# Patient Record
Sex: Female | Born: 1955 | Race: White | Hispanic: No | Marital: Married | Smoking: Never smoker
Health system: Southern US, Community
[De-identification: ages and names within clinical notes are randomized; demographics above are authoritative.]

## PROBLEM LIST (undated history)

## (undated) DIAGNOSIS — M419 Scoliosis, unspecified: Secondary | ICD-10-CM

## (undated) DIAGNOSIS — S62122A Displaced fracture of lunate [semilunar], left wrist, initial encounter for closed fracture: Secondary | ICD-10-CM

## (undated) HISTORY — PX: OTHER SURGICAL HISTORY: SHX169

## (undated) HISTORY — PX: BREAST CYST ASPIRATION: SHX578

## (undated) HISTORY — DX: Scoliosis, unspecified: M41.9

## (undated) HISTORY — DX: Displaced fracture of lunate (semilunar), left wrist, initial encounter for closed fracture: S62.122A

---

## 2006-12-03 ENCOUNTER — Ambulatory Visit: Payer: Self-pay | Admitting: Family Medicine

## 2006-12-03 ENCOUNTER — Encounter (INDEPENDENT_AMBULATORY_CARE_PROVIDER_SITE_OTHER): Payer: Self-pay | Admitting: Internal Medicine

## 2006-12-03 DIAGNOSIS — J069 Acute upper respiratory infection, unspecified: Secondary | ICD-10-CM | POA: Insufficient documentation

## 2006-12-03 DIAGNOSIS — N926 Irregular menstruation, unspecified: Secondary | ICD-10-CM | POA: Insufficient documentation

## 2006-12-03 DIAGNOSIS — M62838 Other muscle spasm: Secondary | ICD-10-CM

## 2006-12-03 DIAGNOSIS — G43909 Migraine, unspecified, not intractable, without status migrainosus: Secondary | ICD-10-CM

## 2006-12-05 ENCOUNTER — Encounter (INDEPENDENT_AMBULATORY_CARE_PROVIDER_SITE_OTHER): Payer: Self-pay | Admitting: Internal Medicine

## 2006-12-11 ENCOUNTER — Telehealth (INDEPENDENT_AMBULATORY_CARE_PROVIDER_SITE_OTHER): Payer: Self-pay | Admitting: Internal Medicine

## 2006-12-18 ENCOUNTER — Telehealth (INDEPENDENT_AMBULATORY_CARE_PROVIDER_SITE_OTHER): Payer: Self-pay | Admitting: Internal Medicine

## 2006-12-27 ENCOUNTER — Ambulatory Visit: Payer: Self-pay | Admitting: Family Medicine

## 2006-12-27 DIAGNOSIS — M4120 Other idiopathic scoliosis, site unspecified: Secondary | ICD-10-CM | POA: Insufficient documentation

## 2006-12-30 ENCOUNTER — Encounter (INDEPENDENT_AMBULATORY_CARE_PROVIDER_SITE_OTHER): Payer: Self-pay | Admitting: Internal Medicine

## 2007-01-09 HISTORY — PX: OTHER SURGICAL HISTORY: SHX169

## 2007-01-17 ENCOUNTER — Encounter (INDEPENDENT_AMBULATORY_CARE_PROVIDER_SITE_OTHER): Payer: Self-pay | Admitting: Internal Medicine

## 2007-01-22 ENCOUNTER — Encounter (INDEPENDENT_AMBULATORY_CARE_PROVIDER_SITE_OTHER): Payer: Self-pay | Admitting: Internal Medicine

## 2007-01-23 ENCOUNTER — Telehealth (INDEPENDENT_AMBULATORY_CARE_PROVIDER_SITE_OTHER): Payer: Self-pay | Admitting: Internal Medicine

## 2007-01-23 DIAGNOSIS — N6009 Solitary cyst of unspecified breast: Secondary | ICD-10-CM

## 2007-02-07 ENCOUNTER — Encounter (INDEPENDENT_AMBULATORY_CARE_PROVIDER_SITE_OTHER): Payer: Self-pay | Admitting: Internal Medicine

## 2007-04-29 ENCOUNTER — Encounter (INDEPENDENT_AMBULATORY_CARE_PROVIDER_SITE_OTHER): Payer: Self-pay | Admitting: Internal Medicine

## 2007-05-26 ENCOUNTER — Encounter (INDEPENDENT_AMBULATORY_CARE_PROVIDER_SITE_OTHER): Payer: Self-pay | Admitting: Internal Medicine

## 2007-06-06 ENCOUNTER — Encounter (INDEPENDENT_AMBULATORY_CARE_PROVIDER_SITE_OTHER): Payer: Self-pay | Admitting: Internal Medicine

## 2007-09-23 ENCOUNTER — Encounter (INDEPENDENT_AMBULATORY_CARE_PROVIDER_SITE_OTHER): Payer: Self-pay | Admitting: Internal Medicine

## 2008-02-23 ENCOUNTER — Encounter (INDEPENDENT_AMBULATORY_CARE_PROVIDER_SITE_OTHER): Payer: Self-pay | Admitting: Internal Medicine

## 2009-09-02 ENCOUNTER — Telehealth: Payer: Self-pay | Admitting: Family Medicine

## 2009-09-16 ENCOUNTER — Encounter: Payer: Self-pay | Admitting: Family Medicine

## 2009-09-17 LAB — CONVERTED CEMR LAB: HDL: 82 mg/dL

## 2009-09-19 ENCOUNTER — Encounter: Payer: Self-pay | Admitting: Family Medicine

## 2009-10-04 ENCOUNTER — Ambulatory Visit: Payer: Self-pay | Admitting: Family Medicine

## 2009-10-06 ENCOUNTER — Telehealth: Payer: Self-pay | Admitting: Family Medicine

## 2009-10-07 ENCOUNTER — Encounter: Payer: Self-pay | Admitting: Family Medicine

## 2009-10-21 ENCOUNTER — Encounter: Payer: Self-pay | Admitting: Family Medicine

## 2009-10-31 ENCOUNTER — Telehealth: Payer: Self-pay | Admitting: Family Medicine

## 2009-11-03 ENCOUNTER — Telehealth: Payer: Self-pay | Admitting: Family Medicine

## 2009-11-24 ENCOUNTER — Ambulatory Visit: Payer: Self-pay | Admitting: Podiatry

## 2010-02-07 NOTE — Progress Notes (Signed)
Summary: needs order for labwork  Phone Note Call from Patient Call back at Work Phone [phone]   Caller: Patient Summary of Call: Pt has appt for physical on 9/26 and she is asking for an order for labwork prior.  She works at [employer].  Please call when ready. Initial call taken by: Lowella Petties CMA,  September 02, 2009 4:35 PM  Follow-up for Phone Call        On my desk. Ruthe Mannan MD  September 05, 2009 7:52 AM  Order mailed to pt. Follow-up by: Lowella Petties CMA,  September 06, 2009 8:03 AM

## 2010-02-07 NOTE — Letter (Signed)
Summary: Generic Letter  [place] [facility]  [address] [place], [place] [address]   Phone: [phone]  Fax: [phone]    09/19/2009  Tamara Kane [address] [place], [place]  [address]  Dear [name],     We have received your lab results and they are within normal limits for your risk factors.  Dr. Dayton Martes says that she will discuss in more detail at your appointment.          Sincerely,      Ruthe Mannan, MD

## 2010-02-07 NOTE — Letter (Signed)
Summary: Results Follow up Letter  [place] [facility]  [address] [place], [place] [address]   Phone: [phone]  Fax: [phone]    10/07/2009 MRN: [id number]  BRUCE MAYERS [address] [place], [place]  [address]  Dear Ms. SAMARA,  The following are the results of your recent test(s):  Test         Result    Pap Smear:        Normal __X___  Not Normal _____ Comments: ______________________________________________________ Cholesterol: LDL(Bad cholesterol):         Your goal is less than:         HDL (Good cholesterol):       Your goal is more than: Comments:  ______________________________________________________ Mammogram:        Normal _____  Not Normal _____ Comments:  ___________________________________________________________________ Hemoccult:        Normal _____  Not normal _______ Comments:    _____________________________________________________________________ Other Tests:    We routinely do not discuss normal results over the telephone.  If you desire a copy of the results, or you have any questions about this information we can discuss them at your next office visit.   Sincerely,      Linde Gillis, CMA (AAMA)for Dr. Ruthe Mannan

## 2010-02-07 NOTE — Progress Notes (Signed)
Summary: topiramate  Phone Note Refill Request Message from:  Fax from Pharmacy on October 31, 2009 3:20 PM  Refills Requested: Medication #1:  TOPAMAX 25 MG TABS 1 tab by mouth qhs   Last Refilled: 10/05/2009 [facility] [phone]  Initial call taken by: Melody Comas,  October 31, 2009 3:21 PM    Prescriptions: TOPAMAX 25 MG TABS (TOPIRAMATE) 1 tab by mouth qhs  #30 x 0   Entered and Authorized by:   Ruthe Mannan MD   Signed by:   Ruthe Mannan MD on 10/31/2009   Method used:   [facility]        [facility] [facility]retail)       [address]       [place], [place]  [address]       Ph: [phone] or [phone]       Fax: [phone]   RxID:   [id number]

## 2010-02-07 NOTE — Progress Notes (Signed)
Summary: not feeling any better  Phone Note Call from Patient Call back at Home Phone [phone]   Caller: Patient Call For: Ruthe Mannan MD Summary of Call: Patient was seen on tusday and was told to call if not feeling any better. She says that she is no better and her cough has gotten worse. She says that her daughter is getting married this weekend and she can't be feeling like this at the wedding. She is asking if she can have something called in [facility].  Initial call taken by: Melody Comas,  October 06, 2009 9:12 AM  Follow-up for Phone Call        [facility] called. in. Ruthe Mannan MD  October 06, 2009 9:25 AM  Rx called to pharmacy, patient notified via telephone.  Follow-up by: Linde Gillis CMA Duncan Dull),  October 06, 2009 9:57 AM    New/Updated Medications: AZITHROMYCIN 250 MG  TABS (AZITHROMYCIN) 2 by  mouth today and then 1 daily for 4 days Prescriptions: AZITHROMYCIN 250 MG  TABS (AZITHROMYCIN) 2 by  mouth today and then 1 daily for 4 days  #6 x 0   Entered and Authorized by:   Ruthe Mannan MD   Signed by:   Ruthe Mannan MD on 10/06/2009   Method used:   [facility]        [facility] (retail)       [address]       [place], [place]  [address]       Ph: [phone] or [phone]       Fax: [phone]   RxID:   [id number]

## 2010-02-07 NOTE — Progress Notes (Signed)
Summary: topamax   Phone Note Refill Request Message from:  Fax from Pharmacy on November 03, 2009 3:52 PM  Refills Requested: Medication #1:  TOPAMAX 25 MG TABS 1 tab by mouth qhs [phone]  Initial call taken by: Melody Comas,  November 03, 2009 4:12 PM    Prescriptions: TOPAMAX 25 MG TABS (TOPIRAMATE) 1 tab by mouth qhs  #90 x 3   Entered and Authorized by:   Ruthe Mannan MD   Signed by:   Ruthe Mannan MD on 11/04/2009   Method used:   Faxed to ...       [phone] (retail)       [address] [address]       [place], [place]  [id number]       Ph: [phone]       Fax: [phone]   RxID:   [id number]

## 2010-02-07 NOTE — Assessment & Plan Note (Signed)
Summary: CPX/BILLIE'S PT/CLE  R/S FROM 10/03/09   Vital Signs:  Patient profile:   55 year old female Height:      63.5 inches Weight:      146 pounds BMI:     25.55 Temp:     98.2 degrees F oral Pulse rate:   84 / minute Pulse rhythm:   regular BP sitting:   130 / 80  (right arm) Cuff size:   regular  Vitals Entered By: Linde Gillis CMA Duncan Dull) (October 04, 2009 8:56 AM) CC: complete physical with pap   History of Present Illness: 55 yo here for CPX.  Migraines- have been getting progressively worse.  Starting menopause and has been having as many as 3 migraines per month.  Associated with nausea, vomiting, and photophobia.  Recenlty has had to miss work for her migraines.  Has never been on migraine prophylaxis.  Immitrex has helped alleviate migraine headaches in past.  No aura or associated focal neurological defecits.  URI- woke up with sinus drainage this morning.  Daughter is getting married this weekend so she hopes that she is not getting sick.  Ears popping.  No fevers, chills, cough, wheezing or SOB.   Well woman- G1P1.  No h/o abnormal pap smears.  Lipid panel this month- TG 45, HDL 82, LDL 110.  Current Medications (verified): 1)  Topamax 25 Mg Tabs (Topiramate) .Marland Kitchen.. 1 Tab By Mouth Qhs 2)  Imitrex 100 Mg Tabs (Sumatriptan Succinate) .Marland Kitchen.. 1 By Mouth At The Onset of Migraine. May Repeat Dose in One Hour If Headache Not Resolved  Allergies (verified): No Known Drug Allergies  Past History:  Past Surgical History: Last updated: 05/26/2007 fx of humerus 1998--rolat blading, fell fx L wrist 6/07  slipped around pool bone fusion  for scioliosis at age 89--from pelvis deviated septum repair --1993 laproscopy for infertility issues--22 yrs ago L breast mass aspiration at 1 o'clock position--02/07/07--Byrnett, repeat aspiration 5/09  Family History: Last updated: 12/12/06 Father: died at 69--in his sleep, no heart issues Mother: 79--arthritis,  Siblings: 2  br--L&W                1 sis--migraines   DM-PGM MI-PGM CVA- 0 Prostate Cancer-0 Breast Cancer-0 Ovarian Cancer-0 Uterine Cancer-0 Colon Cancer-0 Drug/ ETOH Abuse-0 Depression- 0  Social History: Last updated: 12-Dec-2006 Marital Status: Married Children: 1 daughter Occupation: Psychologist, 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  Risk Factors: Caffeine Use: 0 (12-12-06) Exercise: yes (12/12/2006)  Risk Factors: Smoking Status: never (12-12-06) Passive Smoke Exposure: no (12-12-2006)  Review of Systems      See HPI General:  Denies fever and malaise. Eyes:  Denies blurring. ENT:  Denies difficulty swallowing. CV:  Denies chest pain or discomfort. Resp:  Denies cough, shortness of breath, sputum productive, and wheezing. GI:  Complains of nausea and vomiting; denies abdominal pain. GU:  Denies discharge and dysuria. MS:  Denies joint pain, joint redness, and joint swelling. Derm:  Denies rash. Neuro:  Complains of headaches; denies seizures, sensation of room spinning, tingling, tremors, visual disturbances, and weakness. Psych:  Denies anxiety and depression. Endo:  Denies cold intolerance and heat intolerance. Heme:  Denies abnormal bruising and bleeding.  Physical Exam  General:  alert, well-developed, and well-nourished.   Head:  normocephalic, atraumatic, and no abnormalities observed.   Eyes:  pupils equal, pupils round, and no injection.   Ears:  TMs retracted withfluid Nose:  boggy with no  edema Mouth:  injected with no exudate Neck:  no thyromegaly and  no carotid bruits.   Breasts:  No mass, nodules, thickening, tenderness, bulging, retraction, inflamation, nipple discharge or skin changes noted.   Lungs:  normal respiratory effort, no accessory muscle use, and normal breath sounds.   Heart:  normal rate, regular rhythm, and no murmur.   Abdomen:  soft, non-tender, normal bowel sounds, no distention, no masses, no guarding, no rigidity, no abdominal hernia, no  inguinal hernia, no hepatomegaly, and no splenomegaly.   Rectal:  no external abnormalities and no hemorrhoids.   Genitalia:  Pelvic Exam:        External: normal female genitalia without lesions or masses        Vagina: normal without lesions or masses        Cervix: normal without lesions or masses        Adnexa: normal bimanual exam without masses or fullness        Uterus: normal by palpation        Pap smear: performed Msk:  no joint swelling, no joint warmth, and no redness over joints.   has scioliosis with well healed surg scar in thoracic area at vetebrae Extremities:  no edema, dilated capillaries, no varicosities seen Neurologic:  alert & oriented X3, sensation intact to light touch, and gait normal.   Skin:  turgor normal, color normal, and no rashes.   Psych:  normally interactive.     Impression & Recommendations:  Problem # 1:  WELL ADULT EXAM (ICD-V70.0) Reviewed preventive care protocols, scheduled due services, and updated immunizations Discussed nutrition, exercise, diet, and healthy lifestyle.  Schedule colonscopy and mammogram today.  Problem # 2:  URI (ICD-465.9) Assessment: New Likely viral.  Continue supportive care as per pt instructions.  Problem # 3:  MIGRAINE, COMMON (ICD-346.10) Assessment: Deteriorated Will start Topamax for prophylaxis, keep headache journal. Imitrex as needed headache.   Her updated medication list for this problem includes:    Imitrex 100 Mg Tabs (Sumatriptan succinate) .Marland Kitchen... 1 by mouth at the onset of migraine. may repeat dose in one hour if headache not resolved  Complete Medication List: 1)  Topamax 25 Mg Tabs (Topiramate) .Marland Kitchen.. 1 tab by mouth qhs 2)  Imitrex 100 Mg Tabs (Sumatriptan succinate) .Marland Kitchen.. 1 by mouth at the onset of migraine. may repeat dose in one hour if headache not resolved  Other Orders: Gastroenterology Referral (GI) Radiology Referral (Radiology)  Patient Instructions: 1)  Great to meet you. 2)  Please  stop by to see Shirlee Limerick on your way out. 3)  Congratulations on the wedding!!  I hope you have a great weekend. 4)  Get plenty of rest, drink lots of clear liquids, and use Tylenol or Ibuprofen for fever and comfort. Return in 7-10 days if you're not better: sooner if you'er feeling worse.  Prescriptions: IMITREX 100 MG TABS (SUMATRIPTAN SUCCINATE) 1 by mouth at the onset of migraine. May repeat dose in one hour if headache not resolved  #10 x 3   Entered and Authorized by:   Ruthe Mannan MD   Signed by:   Ruthe Mannan MD on 10/04/2009   Method used:   Print then Give to Patient   RxID:   [id number] TOPAMAX 25 MG TABS (TOPIRAMATE) 1 tab by mouth qhs  #30 x 0   Entered and Authorized by:   Ruthe Mannan MD   Signed by:   Ruthe Mannan MD on 10/04/2009   Method used:   Print then Give to Patient   RxID:   [id number]  Prior Medications (reviewed today): None Current Allergies (reviewed today): No known allergies   Flu Vaccine Next Due:  Not Indicated TD Result Date:  10/01/2006 TD Result:  historical HDL Result Date:  09/17/2009 HDL Result:  82 LDL Result Date:  09/17/2009 LDL Result:  110   Prevention & Chronic Care Immunizations   Influenza vaccine: Not documented   Influenza vaccine due: Not Indicated    Tetanus booster: 10/01/2006: historical   Tetanus booster due: 09/30/2016    Pneumococcal vaccine: Not documented  Colorectal Screening   Hemoccult: Not documented    Colonoscopy: Not documented   Colonoscopy action/deferral: GI referral  (10/04/2009)  Other Screening   Pap smear: normal  (12/27/2006)   Pap smear action/deferral: Ordered  (10/04/2009)   Pap smear due: 12/27/2007    Mammogram: Not documented   Mammogram action/deferral: Ordered  (10/04/2009)   Smoking status: never  (12/03/2006)  Lipids   Total Cholesterol: Not documented   LDL: 110  (09/17/2009)   LDL Direct: Not documented   HDL: 82  (09/17/2009)   Triglycerides: Not  documented   Nursing Instructions: GI referral for screening colonoscopy (see order) Pap smear today Schedule screening mammogram (see order)

## 2010-02-23 ENCOUNTER — Encounter: Payer: Self-pay | Admitting: Internal Medicine

## 2010-03-01 NOTE — Letter (Signed)
Summary: Pre Visit Letter Revised  [facility]  [address] [place], [place] [address]   Phone: [phone]  Fax: [phone]        02/23/2010 MRN: [id number]  Tamara Kane [address] [place], [place]  [address]             Procedure Date:  03-09-10 9:30am            Direct Colon Dr Leone Payor  Welcome to the Gastroenterology Division at [facility].    You are scheduled to see a nurse for your pre-procedure visit on 02-28-10 at 10:30am on the 3rd floor at [facility], [address]  We ask that you try to arrive at our office 15 minutes prior to your appointment time to allow for check-in.  Please take a minute to review the attached form.  If you answer "Yes" to one or more of the questions on the first page, we ask that you call the person listed at your earliest opportunity.  If you answer "No" to all of the questions, please complete the rest of the form and bring it to your appointment.    Your nurse visit will consist of discussing your medical and surgical history, your immediate family medical history, and your medications.   If you are unable to list all of your medications on the form, please bring the medication bottles to your appointment and we will list them.  We will need to be aware of both prescribed and over the counter drugs.  We will need to know exact dosage information as well.    Please be prepared to read and sign documents such as consent forms, a financial agreement, and acknowledgement forms.  If necessary, and with your consent, a friend or relative is welcome to sit-in on the nurse visit with you.  Please bring your insurance card so that we may make a copy of it.  If your insurance requires a referral to see a specialist, please bring your referral form from your primary care physician.  No co-pay is required for this nurse visit.     If you cannot keep your appointment, please call [phone] to cancel or reschedule  prior to your appointment date.  This allows [place] the opportunity to schedule an appointment for another patient in need of care.    Thank you for choosing [facility] for your medical needs.  We appreciate the opportunity to care for you.  Please visit [place] at our website  to learn more about our practice.  Sincerely, The Gastroenterology Division

## 2010-03-09 ENCOUNTER — Other Ambulatory Visit: Payer: Self-pay | Admitting: Internal Medicine

## 2010-09-18 ENCOUNTER — Telehealth: Payer: Self-pay | Admitting: *Deleted

## 2010-09-18 NOTE — Telephone Encounter (Signed)
On my desk

## 2010-09-18 NOTE — Telephone Encounter (Signed)
Patient request written order to be mailed to her home address to have labs done at [facility].  She has a physical scheduled for 10/2010 and would like labs prior.  Please advise.

## 2010-09-18 NOTE — Telephone Encounter (Signed)
Left message on machine at home that lab orders were put in the mail today.

## 2010-10-02 ENCOUNTER — Encounter: Payer: Self-pay | Admitting: Family Medicine

## 2010-10-09 ENCOUNTER — Encounter: Payer: Self-pay | Admitting: Family Medicine

## 2010-10-16 ENCOUNTER — Ambulatory Visit (INDEPENDENT_AMBULATORY_CARE_PROVIDER_SITE_OTHER): Payer: 59 | Admitting: Family Medicine

## 2010-10-16 ENCOUNTER — Encounter: Payer: Self-pay | Admitting: Family Medicine

## 2010-10-16 VITALS — BP 110/80 | HR 80 | Temp 98.5°F | Ht 64.5 in | Wt 151.2 lb

## 2010-10-16 DIAGNOSIS — Z0000 Encounter for general adult medical examination without abnormal findings: Secondary | ICD-10-CM | POA: Insufficient documentation

## 2010-10-16 DIAGNOSIS — G43009 Migraine without aura, not intractable, without status migrainosus: Secondary | ICD-10-CM

## 2010-10-16 MED ORDER — SUMATRIPTAN SUCCINATE 100 MG PO TABS: ORAL_TABLET | ORAL | Status: DC

## 2010-10-16 MED ORDER — TOPIRAMATE 25 MG PO TABS: 25.0000 mg | ORAL_TABLET | Freq: Every day | ORAL | Status: DC

## 2010-10-16 NOTE — Progress Notes (Signed)
  Subjective:    Patient ID: Tamara Kane, female    DOB: [birth year], 55 y.o.   MRN: [id number]  HPI  55yo here for CPX.  Migraines- Associated with nausea, vomiting, and photophobia. Topamax 25 mg qhs is helping with prevention. Had 3 migraines in past 3 months but very sensitive to changes in weather. Previously, was only have one every few months since starting Topamax.   Imitrex helping to alleviate migraines quickly.   No aura or associated focal neurological defecits.   Well woman- G1P1.  No h/o abnormal pap smears.   Still has not scheduled her colonoscopy but she will later this winter.  BMET and lipids within normal limits.  Review of Systems See HPI Patient reports no  vision/ hearing changes,anorexia, weight change, fever ,adenopathy, persistant / recurrent hoarseness, swallowing issues, chest pain, edema,persistant / recurrent cough, hemoptysis, dyspnea(rest, exertional, paroxysmal nocturnal), gastrointestinal  bleeding (melena, rectal bleeding), abdominal pain, excessive heart burn, GU symptoms(dysuria, hematuria, pyuria, voiding/incontinence  Issues) syncope, focal weakness, severe memory loss, concerning skin lesions, depression, anxiety, abnormal bruising/bleeding, major joint swelling, breast masses or abnormal vaginal bleeding.       Objective:   Physical Exam BP 110/80  Pulse 80  Temp(Src) 98.5 F (36.9 C) (Oral)  Ht 5' 4.5" (1.638 m)  Wt 151 lb 4 oz (68.607 kg)  BMI 25.56 kg/m2  General:  Well-developed,well-nourished,in no acute distress; alert,appropriate and cooperative throughout examination Head:  normocephalic and atraumatic.   Eyes:  vision grossly intact, pupils equal, pupils round, and pupils reactive to light.   Ears:  R ear normal and L ear normal.   Nose:  no external deformity.   Mouth:  good dentition.   Neck:  No deformities, masses, or tenderness noted. Lungs:  Normal respiratory effort, chest expands symmetrically. Lungs are clear to  auscultation, no crackles or wheezes. Heart:  Normal rate and regular rhythm. S1 and S2 normal without gallop, murmur, click, rub or other extra sounds. Abdomen:  Bowel sounds positive,abdomen soft and non-tender without masses, organomegaly or hernias noted. Msk: + scoliosis with old surgical scars Extremities:  No clubbing, cyanosis, edema, or deformity noted with normal full range of motion of all joints.   Neurologic:  alert & oriented X3 and gait normal.   Skin:  Intact without suspicious lesions or rashes Cervical Nodes:  No lymphadenopathy noted Axillary Nodes:  No palpable lymphadenopathy Psych:  Cognition and judgment appear intact. Alert and cooperative with normal attention span and concentration. No apparent delusions, illusions, hallucinations      Assessment & Plan:   1. Routine general medical examination at a health care facility   Reviewed preventive care protocols, scheduled due services, and updated immunizations Discussed nutrition, exercise, diet, and healthy lifestyle.  IFOB.   2. MIGRAINE, COMMON   Improved.  Continue prophylaxis with topamax and abortive therapy with Imitrex.

## 2010-10-16 NOTE — Patient Instructions (Signed)
Good to see you. Please keep [place] posted about your migraines.

## 2010-10-16 NOTE — Progress Notes (Signed)
[facility]

## 2010-10-27 ENCOUNTER — Other Ambulatory Visit: Payer: 59

## 2010-10-27 ENCOUNTER — Other Ambulatory Visit: Payer: Self-pay | Admitting: Family Medicine

## 2010-10-27 DIAGNOSIS — Z1211 Encounter for screening for malignant neoplasm of colon: Secondary | ICD-10-CM

## 2010-10-30 ENCOUNTER — Encounter: Payer: Self-pay | Admitting: *Deleted

## 2011-10-12 ENCOUNTER — Other Ambulatory Visit: Payer: Self-pay

## 2011-10-12 MED ORDER — TOPIRAMATE 25 MG PO TABS: 25.0000 mg | ORAL_TABLET | Freq: Every day | ORAL | Status: DC

## 2011-10-12 MED ORDER — SUMATRIPTAN SUCCINATE 100 MG PO TABS: ORAL_TABLET | ORAL | Status: DC

## 2011-10-12 NOTE — Telephone Encounter (Signed)
Lab order mailed to patient's home address, advised patient.

## 2011-10-12 NOTE — Telephone Encounter (Signed)
Pt request [facility]ill [facility]. Pt scheduled CPX 12/03/11. Pt works for [employer] and request written order mailed to pts verfied home address to get labs prior to CPX.Please advise.

## 2011-10-12 NOTE — Telephone Encounter (Signed)
Order written, on my desk.

## 2011-11-12 ENCOUNTER — Encounter: Payer: Self-pay | Admitting: Family Medicine

## 2011-12-03 ENCOUNTER — Encounter: Payer: Self-pay | Admitting: Family Medicine

## 2011-12-03 ENCOUNTER — Ambulatory Visit (INDEPENDENT_AMBULATORY_CARE_PROVIDER_SITE_OTHER): Payer: 59 | Admitting: Family Medicine

## 2011-12-03 VITALS — BP 120/80 | HR 88 | Temp 98.0°F | Ht 63.25 in | Wt 148.0 lb

## 2011-12-03 DIAGNOSIS — Z0000 Encounter for general adult medical examination without abnormal findings: Secondary | ICD-10-CM

## 2011-12-03 DIAGNOSIS — Z1211 Encounter for screening for malignant neoplasm of colon: Secondary | ICD-10-CM

## 2011-12-03 NOTE — Progress Notes (Signed)
Subjective:    Patient ID: Tamara Kane, female    DOB: 08/05/1955, 56 y.o.   MRN: [id number]  HPI  56 yo here for CPX.  Overall doing very well.  Still takes Zumba classes regularly.  Migraines- Associated with nausea, vomiting, and photophobia. Topamax 25 mg qhs is helping with prevention. Had 2 migraines in past 2 months but very sensitive to changes in weather. Otherwise, getting them very occasionally.   Imitrex helping to alleviate migraines quickly.   No aura or associated focal neurological defecits.  Well woman- G1P1.  No h/o abnormal pap smears.   LMP 2 years ago.  No post menopausal bleeding.  Sexually active with one partner only. Mammogram scheduled for 01/16/2012.  BMET and lipids within normal limits. HDl 73, LDL 122, TG 44.  Patient Active Problem List  Diagnosis  . MIGRAINE, COMMON  . URI  . BREAST CYST  . IRREGULAR MENSTRUAL CYCLE  . MUSCLE SPASM  . SCOLIOSIS  . Routine general medical examination at a health care facility   Past Medical History  Diagnosis Date  . Fracture of humerus   . Fracture of lunate, left wrist, closed   . Scoliosis    Past Surgical History  Procedure Date  . Bone fusion for scoliosis     age 56  . Left breast mass aspiration 2009    Dr. Lemar Livings   History  Substance Use Topics  . Smoking status: Never Smoker   . Smokeless tobacco: Never Used  . Alcohol Use: Not on file   Family History  Problem Relation Age of Onset  . Diabetes Paternal Grandmother   . Heart disease Paternal Grandmother    No Known Allergies Current Outpatient Prescriptions on File Prior to Visit  Medication Sig Dispense Refill  . SUMAtriptan (IMITREX) 100 MG tablet Take one tablet by mouth at onset of migraine, may repeat in one hour if migraine not resolved  10 tablet  0  . SUMAtriptan (IMITREX) 100 MG tablet Take one tablet by mouth at onset of migraine, may repeat in one hour if migraine not resolved  10 tablet  3  . topiramate (TOPAMAX) 25 MG  tablet Take 1 tablet (25 mg total) by mouth at bedtime.  90 tablet  3   The PMH, PSH, Social History, Family History, Medications, and allergies have been reviewed in [facility], and have been updated if relevant.   Review of Systems See HPI Patient reports no  vision/ hearing changes,anorexia, weight change, fever ,adenopathy, persistant / recurrent hoarseness, swallowing issues, chest pain, edema,persistant / recurrent cough, hemoptysis, dyspnea(rest, exertional, paroxysmal nocturnal), gastrointestinal  bleeding (melena, rectal bleeding), abdominal pain, excessive heart burn, GU symptoms(dysuria, hematuria, pyuria, voiding/incontinence  Issues) syncope, focal weakness, severe memory loss, concerning skin lesions, depression, anxiety, abnormal bruising/bleeding, major joint swelling, breast masses or abnormal vaginal bleeding.       Objective:   Physical Exam BP 120/80  Pulse 88  Temp 98 F (36.7 C)  Ht 5' 3.25" (1.607 m)  Wt 148 lb (67.132 kg)  BMI 26.01 kg/m2  General:  Well-developed,well-nourished,in no acute distress; alert,appropriate and cooperative throughout examination Head:  normocephalic and atraumatic.   Eyes:  vision grossly intact, pupils equal, pupils round, and pupils reactive to light.   Ears:  R ear normal and L ear normal.   Nose:  no external deformity.   Mouth:  good dentition.   Neck:  No deformities, masses, or tenderness noted. Lungs:  Normal respiratory effort, chest expands symmetrically. Lungs are  clear to auscultation, no crackles or wheezes. Heart:  Normal rate and regular rhythm. S1 and S2 normal without gallop, murmur, click, rub or other extra sounds. Abdomen:  Bowel sounds positive,abdomen soft and non-tender without masses, organomegaly or hernias noted. Msk: + scoliosis with old surgical scars Extremities:  No clubbing, cyanosis, edema, or deformity noted with normal full range of motion of all joints.   Neurologic:  alert & oriented X3 and gait normal.     Skin:  Intact without suspicious lesions or rashes Cervical Nodes:  No lymphadenopathy noted Axillary Nodes:  No palpable lymphadenopathy Psych:  Cognition and judgment appear intact. Alert and cooperative with normal attention span and concentration. No apparent delusions, illusions, hallucinations      Assessment & Plan:   1. Routine general medical examination at a health care facility   Reviewed preventive care protocols, scheduled due services, and updated immunizations Discussed nutrition, exercise, diet, and healthy lifestyle.  IFOB.   2. MIGRAINE, COMMON   Improved.  Continue prophylaxis with topamax and abortive therapy with Imitrex.

## 2011-12-03 NOTE — Patient Instructions (Addendum)
Good to see you. Have a wonderful Thanksgiving!  We will call you or send a letter with your stool card results.

## 2012-09-09 ENCOUNTER — Telehealth: Payer: Self-pay | Admitting: Family Medicine

## 2012-09-09 NOTE — Telephone Encounter (Signed)
[facility] Triage Call Report Triage Record Num: [id number] Operator: Maryfrances Bunnell Patient Name: Tamara Kane Call Date & Time: 09/06/2012 8:54:49AM Patient Phone: [phone] PCP: Ruthe Mannan Patient Gender: Female PCP Fax : [phone] Patient DOB: 1955-05-15 Practice Name: [facility] Reason for Call: Caller: [name]/Patient; PCP: Ruthe Mannan (Family Practice); CB#: [phone]; Call regarding Out of town, left her Medication: Topiramate (generic for Topamax at home, and she can t stop it suddenly; Out of town, left Topamax 25mg  at home, takes Q hs; Verified per emr; Per standing orders Topamax 25mg  po Q hs #3 called to [facility] [phone] per Dr. Laury Axon MD. Protocol(s) Used: Office Note Recommended Outcome per Protocol: Information Noted and Sent to Office Reason for Outcome: Caller information to office Care Advice: ~ 09/06/2012 9:05:53AM Page 1 of 1 [id number]

## 2012-10-17 ENCOUNTER — Telehealth: Payer: Self-pay | Admitting: Family Medicine

## 2012-10-17 NOTE — Telephone Encounter (Signed)
Pt works for [employer] and has an apptmt scheduled for a CPE on Monday, Dec. 1st.  She would like for her lab script to be sent to [facility] so she can have her labs drawn there. Thank you.

## 2012-10-31 ENCOUNTER — Other Ambulatory Visit: Payer: Self-pay

## 2012-10-31 NOTE — Telephone Encounter (Signed)
Pt left v/m that pt had CPX with Nicki Reaper NP on 12/08/12 and pt said that was cancelled; pt rescheduled with Dr Dayton Martes for CPX on 02/05/12. Pt request refill of imitrex and topamax to [facility]..Please advise. Pt request cb.

## 2012-11-03 MED ORDER — TOPIRAMATE 25 MG PO TABS: 25.0000 mg | ORAL_TABLET | Freq: Every day | ORAL | Status: DC

## 2012-11-03 MED ORDER — SUMATRIPTAN SUCCINATE 100 MG PO TABS: ORAL_TABLET | ORAL | Status: DC

## 2012-11-03 NOTE — Telephone Encounter (Signed)
meds refill sent to [facility] as instructed. Pt notified done.

## 2012-11-03 NOTE — Telephone Encounter (Signed)
Ok to refill 

## 2012-11-05 ENCOUNTER — Other Ambulatory Visit: Payer: Self-pay | Admitting: Internal Medicine

## 2012-11-05 DIAGNOSIS — Z0000 Encounter for general adult medical examination without abnormal findings: Secondary | ICD-10-CM

## 2012-11-05 NOTE — Telephone Encounter (Signed)
Terri, I have ordered these CP X labs, can you print out the rec and send to [facility] for me?

## 2012-11-05 NOTE — Addendum Note (Signed)
Addended by: Alvina Chou on: 11/05/2012 11:20 AM   Modules accepted: Orders

## 2012-12-08 ENCOUNTER — Encounter: Payer: 59 | Admitting: Internal Medicine

## 2013-02-04 ENCOUNTER — Encounter: Payer: 59 | Admitting: Family Medicine

## 2013-02-09 ENCOUNTER — Ambulatory Visit (INDEPENDENT_AMBULATORY_CARE_PROVIDER_SITE_OTHER): Payer: 59 | Admitting: Family Medicine

## 2013-02-09 ENCOUNTER — Other Ambulatory Visit (HOSPITAL_COMMUNITY)
Admission: RE | Admit: 2013-02-09 | Discharge: 2013-02-09 | Disposition: A | Discharge status: Home / Self Care | Payer: 59 | Source: Ambulatory Visit | Attending: Family Medicine | Admitting: Family Medicine

## 2013-02-09 ENCOUNTER — Encounter: Payer: Self-pay | Admitting: Family Medicine

## 2013-02-09 VITALS — BP 118/68 | HR 88 | Temp 97.9°F | Ht 62.5 in | Wt 153.2 lb

## 2013-02-09 DIAGNOSIS — Z01419 Encounter for gynecological examination (general) (routine) without abnormal findings: Secondary | ICD-10-CM | POA: Insufficient documentation

## 2013-02-09 DIAGNOSIS — Z1231 Encounter for screening mammogram for malignant neoplasm of breast: Secondary | ICD-10-CM

## 2013-02-09 DIAGNOSIS — Z0000 Encounter for general adult medical examination without abnormal findings: Secondary | ICD-10-CM

## 2013-02-09 DIAGNOSIS — N390 Urinary tract infection, site not specified: Secondary | ICD-10-CM

## 2013-02-09 DIAGNOSIS — Z1211 Encounter for screening for malignant neoplasm of colon: Secondary | ICD-10-CM

## 2013-02-09 DIAGNOSIS — G43009 Migraine without aura, not intractable, without status migrainosus: Secondary | ICD-10-CM

## 2013-02-09 LAB — POCT URINALYSIS DIPSTICK
BILIRUBIN UA: NEGATIVE
GLUCOSE UA: NEGATIVE
Ketones, UA: NEGATIVE
Nitrite, UA: NEGATIVE
PH UA: 6.5
Spec Grav, UA: 1.015
Urobilinogen, UA: 0.2

## 2013-02-09 MED ORDER — SUMATRIPTAN SUCCINATE 100 MG PO TABS: ORAL_TABLET | ORAL | Status: DC

## 2013-02-09 MED ORDER — TOPIRAMATE 50 MG PO TABS
50.0000 mg | ORAL_TABLET | Freq: Every day | ORAL | Status: DC
Start: 2013-02-09 — End: 2014-03-04

## 2013-02-09 NOTE — Assessment & Plan Note (Signed)
Reviewed preventive care protocols, scheduled due services, and updated immunizations Discussed nutrition, exercise, diet, and healthy lifestyle.  Labs requested from [facility].

## 2013-02-09 NOTE — Assessment & Plan Note (Signed)
Pap smear done today

## 2013-02-09 NOTE — Progress Notes (Signed)
Subjective:    Patient ID: Tamara Kane, female    DOB: 02-09-1955, 58 y.o.   MRN: [id number]  HPI  58 yo pleasant female here for CPX.  Had labs done at [facility]- we have not yet received them.  Treated for UTI with 3 day course of cipro on 1/8 (walk in clinic).  Still having some intermittent dysuria.  No back pain or fevers.  No hematuria.  Never had a colonoscopy but does do routine IFOB.  Denies any changes in her bowel habits or blood in her stool.  Overall doing very well.  Still takes Zumba classes regularly. Wt Readings from Last 3 Encounters:  02/09/13 153 lb 4 oz (69.514 kg)  12/03/11 148 lb (67.132 kg)  10/16/10 151 lb 4 oz (68.607 kg)     Migraines- Associated with nausea, vomiting, and photophobia. Topamax 25 mg qhs is helping with prevention but since June has been having more- at least 1 per month.  Seems to be sensitive to changes in weather and decreased sleep. Otherwise, getting them very occasionally.   Imitrex helping to alleviate migraines quickly.   No aura or associated focal neurological defecits.  Well woman- G1P1.  No h/o abnormal pap smears.   LMP 4 years ago.  No post menopausal bleeding.  Sexually active with one partner only. Last pap smear was in 09/2009.   Patient Active Problem List   Diagnosis Date Noted  . Routine general medical examination at a health care facility 10/16/2010  . SCOLIOSIS 12/27/2006  . MIGRAINE, COMMON 12/03/2006  . IRREGULAR MENSTRUAL CYCLE 12/03/2006  . MUSCLE SPASM 12/03/2006   Past Medical History  Diagnosis Date  . Fracture of humerus   . Fracture of lunate, left wrist, closed   . Scoliosis    Past Surgical History  Procedure Laterality Date  . Bone fusion for scoliosis      age 58  . Left breast mass aspiration  2009    Dr. Lemar Livings   History  Substance Use Topics  . Smoking status: Never Smoker   . Smokeless tobacco: Never Used  . Alcohol Use: Not on file   Family History  Problem Relation Age of  Onset  . Diabetes Paternal Grandmother   . Heart disease Paternal Grandmother    No Known Allergies Current Outpatient Prescriptions on File Prior to Visit  Medication Sig Dispense Refill  . SUMAtriptan (IMITREX) 100 MG tablet Take one tablet by mouth at onset of migraine, may repeat in one hour if migraine not resolved  10 tablet  1  . topiramate (TOPAMAX) 25 MG tablet Take 1 tablet (25 mg total) by mouth at bedtime.  90 tablet  1   No current facility-administered medications on file prior to visit.   The PMH, PSH, Social History, Family History, Medications, and allergies have been reviewed in [facility], and have been updated if relevant.   Review of Systems See HPI Patient reports no  vision/ hearing changes,anorexia, weight change, fever ,adenopathy, persistant / recurrent hoarseness, swallowing issues, chest pain, edema,persistant / recurrent cough, hemoptysis, dyspnea(rest, exertional, paroxysmal nocturnal), gastrointestinal  bleeding (melena, rectal bleeding), abdominal pain, excessive heart burn, GU symptoms(dysuria, hematuria, pyuria, voiding/incontinence  Issues) syncope, focal weakness, severe memory loss, concerning skin lesions, depression, anxiety, abnormal bruising/bleeding, major joint swelling, breast masses or abnormal vaginal bleeding.       Objective:   Physical Exam BP 118/68  Pulse 88  Temp(Src) 97.9 F (36.6 C) (Oral)  Ht 5' 2.5" (1.588 m)  Wt  153 lb 4 oz (69.514 kg)  BMI 27.57 kg/m2  SpO2 95%   General:  Well-developed,well-nourished,in no acute distress; alert,appropriate and cooperative throughout examination Head:  normocephalic and atraumatic.   Eyes:  vision grossly intact, pupils equal, pupils round, and pupils reactive to light.   Ears:  R ear normal and L ear normal.   Nose:  no external deformity.   Mouth:  good dentition.   Neck:  No deformities, masses, or tenderness noted. Breasts:  No mass, nodules, thickening, tenderness, bulging, retraction,  inflamation, nipple discharge or skin changes noted.   Lungs:  Normal respiratory effort, chest expands symmetrically. Lungs are clear to auscultation, no crackles or wheezes. Heart:  Normal rate and regular rhythm. S1 and S2 normal without gallop, murmur, click, rub or other extra sounds. Abdomen:  Bowel sounds positive,abdomen soft and non-tender without masses, organomegaly or hernias noted. Rectal:  no external abnormalities.   Genitalia:  Pelvic Exam:        External: normal female genitalia without lesions or masses        Vagina: normal without lesions or masses        Cervix: normal without lesions or masses        Adnexa: normal bimanual exam without masses or fullness        Uterus: normal by palpation        Pap smear: performed Msk:    + scoliosis with old surgical scars Extremities:  No clubbing, cyanosis, edema, or deformity noted with normal full range of motion of all joints.   Neurologic:  alert & oriented X3 and gait normal.   Skin:  Intact without suspicious lesions or rashes Cervical Nodes:  No lymphadenopathy noted Axillary Nodes:  No palpable lymphadenopathy Psych:  Cognition and judgment appear intact. Alert and cooperative with normal attention span and concentration. No apparent delusions, illusions, hallucinations       Assessment & Plan:

## 2013-02-09 NOTE — Assessment & Plan Note (Signed)
S/p cipro. UA positive for trace LE and trace RBC. Will send for cx. No tx for now.

## 2013-02-09 NOTE — Patient Instructions (Addendum)
Great to see you. [facility] to [facility] your [facility] [phone]).  I will call you once I have reviewed your lab work.  Please pick up your stool cards from the lab.  We are increasing your topamax to 50 mg nightly.  Call me with an update.    We will call you with your urine culture results.

## 2013-02-09 NOTE — Assessment & Plan Note (Signed)
Deteriorated. Increase topamax to 50 mg qhs. Call or return to clinic prn if these symptoms worsen or fail to improve as anticipated. The patient indicates understanding of these issues and agrees with the plan.

## 2013-02-09 NOTE — Progress Notes (Signed)
Pre-visit discussion using our clinic review tool. No additional management support is needed unless otherwise documented below in the visit note.  

## 2013-02-10 LAB — URINE CULTURE
Colony Count: NO GROWTH
Organism ID, Bacteria: NO GROWTH

## 2013-02-16 ENCOUNTER — Encounter: Payer: Self-pay | Admitting: Family Medicine

## 2013-03-04 ENCOUNTER — Other Ambulatory Visit (INDEPENDENT_AMBULATORY_CARE_PROVIDER_SITE_OTHER): Payer: 59

## 2013-03-04 DIAGNOSIS — Z1211 Encounter for screening for malignant neoplasm of colon: Secondary | ICD-10-CM

## 2013-03-04 LAB — FECAL OCCULT BLOOD, IMMUNOCHEMICAL: Fecal Occult Bld: NEGATIVE

## 2013-03-06 ENCOUNTER — Encounter: Payer: Self-pay | Admitting: *Deleted

## 2014-02-17 ENCOUNTER — Telehealth: Payer: Self-pay | Admitting: Family Medicine

## 2014-02-17 NOTE — Telephone Encounter (Signed)
Patient has an appointment for a cpx on 03/04/14.  Patient works for [employer] and needs an order mailed to her to have the lab work done.

## 2014-02-19 ENCOUNTER — Other Ambulatory Visit: Payer: Self-pay | Admitting: Internal Medicine

## 2014-02-19 DIAGNOSIS — Z0000 Encounter for general adult medical examination without abnormal findings: Secondary | ICD-10-CM

## 2014-02-19 NOTE — Addendum Note (Signed)
Addended by: Alvina ChouWALSH, Taliesin Hartlage J on: 02/19/2014 02:19 PM   Modules accepted: Orders

## 2014-02-19 NOTE — Telephone Encounter (Signed)
Future Labs have been entered for [facility]. Terri- can these just be electronically released, if not we need to print recs to fax

## 2014-03-04 ENCOUNTER — Encounter: Payer: Self-pay | Admitting: Family Medicine

## 2014-03-04 ENCOUNTER — Ambulatory Visit (INDEPENDENT_AMBULATORY_CARE_PROVIDER_SITE_OTHER): Payer: 59 | Admitting: Family Medicine

## 2014-03-04 VITALS — BP 124/74 | HR 85 | Temp 97.7°F | Ht 63.0 in | Wt 151.4 lb

## 2014-03-04 DIAGNOSIS — Z1211 Encounter for screening for malignant neoplasm of colon: Secondary | ICD-10-CM

## 2014-03-04 DIAGNOSIS — Z1231 Encounter for screening mammogram for malignant neoplasm of breast: Secondary | ICD-10-CM

## 2014-03-04 DIAGNOSIS — Z0000 Encounter for general adult medical examination without abnormal findings: Secondary | ICD-10-CM

## 2014-03-04 DIAGNOSIS — G43709 Chronic migraine without aura, not intractable, without status migrainosus: Secondary | ICD-10-CM

## 2014-03-04 MED ORDER — ONDANSETRON 4 MG PO TBDP: 4.0000 mg | ORAL_TABLET | Freq: Three times a day (TID) | ORAL | Status: DC | PRN

## 2014-03-04 MED ORDER — TOPIRAMATE 50 MG PO TABS: 50.0000 mg | ORAL_TABLET | Freq: Every day | ORAL | Status: DC

## 2014-03-04 MED ORDER — SUMATRIPTAN SUCCINATE 100 MG PO TABS: ORAL_TABLET | ORAL | Status: DC

## 2014-03-04 NOTE — Assessment & Plan Note (Signed)
Deteriorated this month but last month, did not have any migraines. Will continue current rx and eRx sent for zofran to use prn nausea/vomiting with her migraines. The patient indicates understanding of these issues and agrees with the plan.

## 2014-03-04 NOTE — Progress Notes (Signed)
Pre visit review using our clinic review tool, if applicable. No additional management support is needed unless otherwise documented below in the visit note. 

## 2014-03-04 NOTE — Progress Notes (Signed)
Subjective:    Patient ID: Tamara Kane, female    DOB: 08-24-1955, 59 y.o.   MRN: [id number]  HPI  59 yo pleasant female here for CPX. Did get her flu shot at work. Tdap 2008  Pap smear done by me- 02/09/13.   G1P1.  No h/o abnormal pap smears.   LMP 5 years ago.  No post menopausal bleeding.  Sexually active with one partner only.  Never had a colonoscopy but does do routine IFOB.  Denies any changes in her bowel habits or blood in her stool. No family history of colon cancer.  Overall doing very well- excited for her trip to [place] wit her family this Friday.   Wt Readings from Last 3 Encounters:  03/04/14 151 lb 6.4 oz (68.675 kg)  02/09/13 153 lb 4 oz (69.514 kg)  12/03/11 148 lb (67.132 kg)     Migraines- Associated with nausea, vomiting, and photophobia. Has been worse this month- has already had two migraines associated with vomiting this month.  She thinks these are triggered from sinus headaches and extreme changes in weather.  Topamax 50 mg qhs (increased from 25 mg qhs last year) is helping with prevention but since June has been having more- at least 1 per month.  Imitrex helping to alleviate migraines quickly.   No aura or associated focal neurological defecits.     Patient Active Problem List   Diagnosis Date Noted  . UTI (urinary tract infection) 02/09/2013  . Encounter for routine gynecological examination 02/09/2013  . Routine general medical examination at a health care facility 10/16/2010  . SCOLIOSIS 12/27/2006  . MIGRAINE, COMMON 12/03/2006  . IRREGULAR MENSTRUAL CYCLE 12/03/2006  . MUSCLE SPASM 12/03/2006   Past Medical History  Diagnosis Date  . Fracture of humerus   . Fracture of lunate, left wrist, closed   . Scoliosis    Past Surgical History  Procedure Laterality Date  . Bone fusion for scoliosis      age 59  . Left breast mass aspiration  2009    Dr. Lemar Livings   History  Substance Use Topics  . Smoking status: Never Smoker   .  Smokeless tobacco: Never Used  . Alcohol Use: Not on file   Family History  Problem Relation Age of Onset  . Diabetes Paternal Grandmother   . Heart disease Paternal Grandmother    No Known Allergies No current outpatient prescriptions on file prior to visit.   No current facility-administered medications on file prior to visit.   The PMH, PSH, Social History, Family History, Medications, and allergies have been reviewed in [facility], and have been updated if relevant.   Review of Systems Review of Systems  Constitutional: Negative.   HENT: Positive for sinus pressure.   Eyes: Negative.   Respiratory: Negative.   Cardiovascular: Negative.   Gastrointestinal: Positive for nausea and vomiting.  Genitourinary: Negative.   Musculoskeletal: Negative.   Allergic/Immunologic: Negative.   Neurological: Positive for headaches.  Hematological: Negative.   Psychiatric/Behavioral: Negative.         Objective:   Physical Exam Pulse 85  Temp(Src) 97.7 F (36.5 C) (Oral)  Ht  (1.6 m)  Wt 151 lb 6.4 oz (68.675 kg)  BMI 26.83 kg/m2   General:  Well-developed,well-nourished,in no acute distress; alert,appropriate and cooperative throughout examination Head:  normocephalic and atraumatic.   Eyes:  vision grossly intact, pupils equal, pupils round, and pupils reactive to light.   Ears:  R ear normal and L  ear normal.   Nose:  no external deformity.   Mouth:  good dentition.   Neck:  No deformities, masses, or tenderness noted. Breasts:  No mass, nodules, thickening, tenderness, bulging, retraction, inflamation, nipple discharge or skin changes noted.   Lungs:  Normal respiratory effort, chest expands symmetrically. Lungs are clear to auscultation, no crackles or wheezes. Heart:  Normal rate and regular rhythm. S1 and S2 normal without gallop, murmur, click, rub or other extra sounds. Abdomen:  Bowel sounds positive,abdomen soft and non-tender without masses, organomegaly or hernias  noted. Msk:   + scoliosis with old surgical scars Extremities:  No clubbing, cyanosis, edema, or deformity noted with normal full range of motion of all joints.   Neurologic:  alert & oriented X3 and gait normal.   Skin:  Intact without suspicious lesions or rashes Cervical Nodes:  No lymphadenopathy noted Axillary Nodes:  No palpable lymphadenopathy Psych:  Cognition and judgment appear intact. Alert and cooperative with normal attention span and concentration. No apparent delusions, illusions, hallucinations       Assessment & Plan:

## 2014-03-04 NOTE — Patient Instructions (Addendum)
Great to see you. Please call [facility] to get your mammogram.  Please do pick up your stool cards.

## 2014-03-04 NOTE — Assessment & Plan Note (Signed)
Reviewed preventive care protocols, scheduled due services, and updated immunizations Discussed nutrition, exercise, diet, and healthy lifestyle.  Order given for her to have labs done at [facility].

## 2014-03-05 NOTE — Addendum Note (Signed)
Addended by: Alvina ChouWALSH, TERRI J on: 03/05/2014 11:32 AM   Modules accepted: Orders

## 2014-08-03 ENCOUNTER — Telehealth: Payer: Self-pay

## 2014-08-03 NOTE — Telephone Encounter (Signed)
Left a voicemail for patient in regards to scheduling a Mammogram.  

## 2014-08-04 NOTE — Telephone Encounter (Signed)
Error

## 2015-02-01 ENCOUNTER — Other Ambulatory Visit: Payer: Self-pay | Admitting: Family Medicine

## 2015-02-24 ENCOUNTER — Encounter: Payer: Self-pay | Admitting: Family Medicine

## 2015-02-24 ENCOUNTER — Ambulatory Visit (INDEPENDENT_AMBULATORY_CARE_PROVIDER_SITE_OTHER): Payer: 59 | Admitting: Family Medicine

## 2015-02-24 VITALS — BP 122/72 | HR 67 | Temp 97.6°F | Ht 63.0 in | Wt 145.5 lb

## 2015-02-24 DIAGNOSIS — G43709 Chronic migraine without aura, not intractable, without status migrainosus: Secondary | ICD-10-CM

## 2015-02-24 DIAGNOSIS — Z1159 Encounter for screening for other viral diseases: Secondary | ICD-10-CM | POA: Diagnosis not present

## 2015-02-24 DIAGNOSIS — Z0000 Encounter for general adult medical examination without abnormal findings: Secondary | ICD-10-CM

## 2015-02-24 DIAGNOSIS — Z1211 Encounter for screening for malignant neoplasm of colon: Secondary | ICD-10-CM

## 2015-02-24 DIAGNOSIS — Z01419 Encounter for gynecological examination (general) (routine) without abnormal findings: Secondary | ICD-10-CM | POA: Diagnosis not present

## 2015-02-24 MED ORDER — SUMATRIPTAN SUCCINATE 100 MG PO TABS: ORAL_TABLET | ORAL | Status: DC

## 2015-02-24 MED ORDER — TOPIRAMATE 50 MG PO TABS
50.0000 mg | ORAL_TABLET | Freq: Every day | ORAL | Status: DC
Start: 2015-02-24 — End: 2015-09-27

## 2015-02-24 NOTE — Progress Notes (Signed)
Subjective:    Patient ID: Tamara Kane, female    DOB: [birth year], 60 y.o.   MRN: [id number]  HPI  60 yo pleasant female here for CPX. Did get her flu shot at work. Tdap 2008  ?mammogram  Pap smear done by me- 02/09/13.   G1P1.  No h/o abnormal pap smears.   LMP 6 years ago.  No post menopausal bleeding.  Sexually active with one partner only.  Never had a colonoscopy but did do IFOB.  Did not return them last year.    Denies any changes in her bowel habits or blood in her stool. No family history of colon cancer.  Has lost 10 pounds with weight watchers.  She is very pleased about this. Wt Readings from Last 3 Encounters:  02/24/15 145 lb 8 oz (65.998 kg)  03/04/14 151 lb 6.4 oz (68.675 kg)  02/09/13 153 lb 4 oz (69.514 kg)     Migraines- Associated with nausea, vomiting, and photophobia. Topamax 50 mg qhs is helpful for prophylaxis. Imitrex helping to alleviate migraines quickly.   No aura or associated focal neurological defecits.    Lab Results  Component Value Date   HDL 82 09/17/2009   LDLCALC 110 09/17/2009   No results found for: CREATININE No results found for: NA, K, CL, CO2  Patient Active Problem List   Diagnosis Date Noted  . Encounter for routine gynecological examination 02/09/2013  . Routine general medical examination at a health care facility 10/16/2010  . SCOLIOSIS 12/27/2006  . Migraine 12/03/2006   Past Medical History  Diagnosis Date  . Fracture of humerus   . Fracture of lunate, left wrist, closed   . Scoliosis    Past Surgical History  Procedure Laterality Date  . Bone fusion for scoliosis      age 31  . Left breast mass aspiration  2009    Dr. Lemar Livings   Social History  Substance Use Topics  . Smoking status: Never Smoker   . Smokeless tobacco: Never Used  . Alcohol Use: None   Family History  Problem Relation Age of Onset  . Diabetes Paternal Grandmother   . Heart disease Paternal Grandmother    No Known  Allergies Current Outpatient Prescriptions on File Prior to Visit  Medication Sig Dispense Refill  . ondansetron (ZOFRAN ODT) 4 MG disintegrating tablet Take 1 tablet (4 mg total) by mouth every 8 (eight) hours as needed for nausea or vomiting. 20 tablet 0   No current facility-administered medications on file prior to visit.   The PMH, PSH, Social History, Family History, Medications, and allergies have been reviewed in [facility], and have been updated if relevant.   Review of Systems Review of Systems  Constitutional: Negative.   HENT: Negative for sinus pressure.   Eyes: Negative.   Respiratory: Negative.   Cardiovascular: Negative.   Gastrointestinal: Negative for nausea and vomiting.  Genitourinary: Negative.   Musculoskeletal: Negative.   Allergic/Immunologic: Negative.   Neurological: Negative for headaches.  Hematological: Negative.   Psychiatric/Behavioral: Negative.         Objective:   Physical Exam Ht  (1.6 m)  Wt 145 lb 8 oz (65.998 kg)  BMI 25.78 kg/m2   General:  Well-developed,well-nourished,in no acute distress; alert,appropriate and cooperative throughout examination Head:  normocephalic and atraumatic.   Eyes:  vision grossly intact, pupils equal, pupils round, and pupils reactive to light.   Ears:  R ear normal and L ear normal.   Nose:  no  external deformity.   Mouth:  good dentition.   Neck:  No deformities, masses, or tenderness noted. Breasts:  No mass, nodules, thickening, tenderness, bulging, retraction, inflamation, nipple discharge or skin changes noted.   Lungs:  Normal respiratory effort, chest expands symmetrically. Lungs are clear to auscultation, no crackles or wheezes. Heart:  Normal rate and regular rhythm. S1 and S2 normal without gallop, murmur, click, rub or other extra sounds. Abdomen:  Bowel sounds positive,abdomen soft and non-tender without masses, organomegaly or hernias noted. Msk:   + scoliosis with old surgical  scars Extremities:  No clubbing, cyanosis, edema, or deformity noted with normal full range of motion of all joints.   Neurologic:  alert & oriented X3 and gait normal.   Skin:  Intact without suspicious lesions or rashes Cervical Nodes:  No lymphadenopathy noted Axillary Nodes:  No palpable lymphadenopathy Psych:  Cognition and judgment appear intact. Alert and cooperative with normal attention span and concentration. No apparent delusions, illusions, hallucinations       Assessment & Plan:

## 2015-02-24 NOTE — Patient Instructions (Signed)
Great to see you. Please call to schedule your mammogram.  We will call you with your results from today.

## 2015-02-24 NOTE — Assessment & Plan Note (Signed)
Reviewed preventive care protocols, scheduled due services, and updated immunizations Discussed nutrition, exercise, diet, and healthy lifestyle.  Mammogram ordered- pt to schedule.  Labs today.  Orders Placed This Encounter  Procedures  . Fecal occult blood, imunochemical  . CBC with Differential/Platelet  . Comprehensive metabolic panel  . Lipid panel  . TSH  . Hepatitis C Antibody  . HIV antibody (with reflex)

## 2015-02-24 NOTE — Addendum Note (Signed)
Addended by: Baldomero Lamy on: 02/24/2015 12:46 PM   Modules accepted: Orders, SmartSet

## 2015-02-24 NOTE — Progress Notes (Signed)
Pre visit review using our clinic review tool, if applicable. No additional management support is needed unless otherwise documented below in the visit note. 

## 2015-02-24 NOTE — Assessment & Plan Note (Signed)
Well controlled with current rxs. No changes made today. 

## 2015-02-25 LAB — COMPREHENSIVE METABOLIC PANEL
A/G RATIO: 2 (ref 1.1–2.5)
ALBUMIN: 4.5 g/dL (ref 3.5–5.5)
ALK PHOS: 81 IU/L (ref 39–117)
ALT: 17 IU/L (ref 0–32)
AST: 19 IU/L (ref 0–40)
BILIRUBIN TOTAL: 0.3 mg/dL (ref 0.0–1.2)
BUN / CREAT RATIO: 23 (ref 9–23)
BUN: 15 mg/dL (ref 6–24)
CHLORIDE: 103 mmol/L (ref 96–106)
CO2: 20 mmol/L (ref 18–29)
CREATININE: 0.65 mg/dL (ref 0.57–1.00)
Calcium: 9.5 mg/dL (ref 8.7–10.2)
GFR calc Af Amer: 114 mL/min/{1.73_m2} (ref >59)
GFR calc non Af Amer: 99 mL/min/{1.73_m2} (ref >59)
GLOBULIN, TOTAL: 2.2 g/dL (ref 1.5–4.5)
Glucose: 82 mg/dL (ref 65–99)
POTASSIUM: 4.1 mmol/L (ref 3.5–5.2)
SODIUM: 140 mmol/L (ref 134–144)
Total Protein: 6.7 g/dL (ref 6.0–8.5)

## 2015-02-25 LAB — LIPID PANEL
CHOLESTEROL TOTAL: 210 mg/dL — AB (ref 100–199)
Chol/HDL Ratio: 2.7 ratio units (ref 0.0–4.4)
HDL: 79 mg/dL (ref >39)
LDL Calculated: 122 mg/dL — ABNORMAL HIGH (ref 0–99)
Triglycerides: 43 mg/dL (ref 0–149)
VLDL CHOLESTEROL CAL: 9 mg/dL (ref 5–40)

## 2015-02-25 LAB — CBC WITH DIFFERENTIAL/PLATELET
Basophils Absolute: 0 10*3/uL (ref 0.0–0.2)
Basos: 0 %
EOS (ABSOLUTE): 0.3 10*3/uL (ref 0.0–0.4)
EOS: 5 %
HEMATOCRIT: 39.3 % (ref 34.0–46.6)
HEMOGLOBIN: 13.5 g/dL (ref 11.1–15.9)
Immature Grans (Abs): 0 10*3/uL (ref 0.0–0.1)
Immature Granulocytes: 0 %
LYMPHS ABS: 1.7 10*3/uL (ref 0.7–3.1)
Lymphs: 27 %
MCH: 31 pg (ref 26.6–33.0)
MCHC: 34.4 g/dL (ref 31.5–35.7)
MCV: 90 fL (ref 79–97)
MONOCYTES: 12 %
MONOS ABS: 0.7 10*3/uL (ref 0.1–0.9)
NEUTROS ABS: 3.6 10*3/uL (ref 1.4–7.0)
Neutrophils: 56 %
Platelets: 225 10*3/uL (ref 150–379)
RBC: 4.36 x10E6/uL (ref 3.77–5.28)
RDW: 12.9 % (ref 12.3–15.4)
WBC: 6.4 10*3/uL (ref 3.4–10.8)

## 2015-02-25 LAB — TSH: TSH: 4.81 u[IU]/mL — AB (ref 0.450–4.500)

## 2015-02-25 LAB — HIV ANTIBODY (ROUTINE TESTING W REFLEX): HIV SCREEN 4TH GENERATION: NONREACTIVE

## 2015-02-25 LAB — HEPATITIS C ANTIBODY

## 2015-03-01 ENCOUNTER — Encounter: Payer: Self-pay | Admitting: *Deleted

## 2015-09-27 ENCOUNTER — Other Ambulatory Visit: Payer: Self-pay | Admitting: Family Medicine

## 2015-12-15 ENCOUNTER — Ambulatory Visit (INDEPENDENT_AMBULATORY_CARE_PROVIDER_SITE_OTHER): Payer: 59 | Admitting: Family Medicine

## 2015-12-15 ENCOUNTER — Encounter: Payer: Self-pay | Admitting: Family Medicine

## 2015-12-15 VITALS — BP 122/80 | HR 83 | Temp 97.6°F | Wt 158.5 lb

## 2015-12-15 DIAGNOSIS — Z23 Encounter for immunization: Secondary | ICD-10-CM | POA: Diagnosis not present

## 2015-12-15 DIAGNOSIS — J069 Acute upper respiratory infection, unspecified: Secondary | ICD-10-CM | POA: Diagnosis not present

## 2015-12-15 MED ORDER — HYDROCOD POLST-CPM POLST ER 10-8 MG/5ML PO SUER: 5.0000 mL | Freq: Two times a day (BID) | ORAL | 0 refills | Status: DC | PRN

## 2015-12-15 NOTE — Addendum Note (Signed)
Addended by: Desmond DikeKNIGHT, Pattie Flaharty H on: 12/15/2015 12:37 PM   Modules accepted: Orders

## 2015-12-15 NOTE — Progress Notes (Signed)
SUBJECTIVE:  Tamara Kane is a 60 y.o. female who complains of coryza, congestion, sneezing and productive cough for 7 days. She denies a history of anorexia and chest pain and denies a history of asthma. Patient denies smoke cigarettes.   Current Outpatient Prescriptions on File Prior to Visit  Medication Sig Dispense Refill  . SUMAtriptan (IMITREX) 100 MG tablet Take one tablet by mouth at onset of migraine, may repeat in one hour if migraine not resolved 10 tablet 5  . topiramate (TOPAMAX) 50 MG tablet Take 1 tablet by mouth at  bedtime 90 tablet 1   No current facility-administered medications on file prior to visit.     No Known Allergies  Past Medical History:  Diagnosis Date  . Fracture of humerus   . Fracture of lunate, left wrist, closed   . Scoliosis     Past Surgical History:  Procedure Laterality Date  . bone fusion for scoliosis     age 60  . left breast mass aspiration  2009   Dr. Lemar LivingsByrnett    Family History  Problem Relation Age of Onset  . Diabetes Paternal Grandmother   . Heart disease Paternal Grandmother     Social History   Social History  . Marital status: Married    Spouse name: N/A  . Number of children: N/A  . Years of education: N/A   Occupational History  . Not on file.   Social History Main Topics  . Smoking status: Never Smoker  . Smokeless tobacco: Never Used  . Alcohol use Not on file  . Drug use: Unknown  . Sexual activity: Not on file   Other Topics Concern  . Not on file   Social History Narrative   Married, one daughter.   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   The PMH, PSH, Social History, Family History, Medications, and [facility], and have been updated if relevant.  OBJECTIVE: BP 122/80   Pulse 83   Temp 97.6 F (36.4 C) (Oral)   Wt 158 lb 8 oz (71.9 kg)   SpO2 96%   BMI 28.08 kg/m   She appears well, vital signs are as noted. Ears normal.  Throat and pharynx normal.  Neck supple.  No adenopathy in the neck. Nose is congested. Sinuses non tender. The chest is clear, without wheezes or rales.  ASSESSMENT:  viral upper respiratory illness  PLAN: Symptomatic therapy suggested: push fluids, rest and return office visit prn if symptoms persist or worsen. Lack of antibiotic effectiveness discussed with her. Call or return to clinic prn if these symptoms worsen or fail to improve as anticipated.

## 2015-12-15 NOTE — Progress Notes (Signed)
Pre visit review using our clinic review tool, if applicable. No additional management support is needed unless otherwise documented below in the visit note. 

## 2016-04-26 ENCOUNTER — Other Ambulatory Visit: Payer: Self-pay | Admitting: Family Medicine

## 2016-07-16 ENCOUNTER — Other Ambulatory Visit: Payer: Self-pay | Admitting: Family Medicine

## 2016-07-17 NOTE — Telephone Encounter (Signed)
Last refill 04/27/16 Last OV 12/15/15 Ok to refill?

## 2017-06-08 ENCOUNTER — Other Ambulatory Visit: Payer: Self-pay | Admitting: Family Medicine

## 2017-07-08 ENCOUNTER — Encounter: Payer: Self-pay | Admitting: Family Medicine

## 2017-07-08 ENCOUNTER — Ambulatory Visit (INDEPENDENT_AMBULATORY_CARE_PROVIDER_SITE_OTHER): Payer: Managed Care, Other (non HMO) | Admitting: Family Medicine

## 2017-07-08 VITALS — BP 130/84 | HR 78 | Temp 98.5°F | Ht 63.0 in | Wt 155.6 lb

## 2017-07-08 DIAGNOSIS — G43709 Chronic migraine without aura, not intractable, without status migrainosus: Secondary | ICD-10-CM

## 2017-07-08 DIAGNOSIS — Z1239 Encounter for other screening for malignant neoplasm of breast: Secondary | ICD-10-CM

## 2017-07-08 DIAGNOSIS — Z1231 Encounter for screening mammogram for malignant neoplasm of breast: Secondary | ICD-10-CM | POA: Diagnosis not present

## 2017-07-08 MED ORDER — SUMATRIPTAN SUCCINATE 100 MG PO TABS: ORAL_TABLET | ORAL | 1 refills | Status: DC

## 2017-07-08 MED ORDER — TOPIRAMATE 50 MG PO TABS: 50.0000 mg | ORAL_TABLET | Freq: Every day | ORAL | 2 refills | Status: DC

## 2017-07-08 MED ORDER — FEXOFENADINE HCL 180 MG PO TABS: 180.0000 mg | ORAL_TABLET | Freq: Every day | ORAL | Status: DC

## 2017-07-08 NOTE — Progress Notes (Signed)
Subjective:   Patient ID: Tamara Kane, female    DOB: 07/08/1955, 62 y.o.   MRN: [id number]  Tamara Kane is a pleasant 62 y.o. year old female who presents to clinic today with Medication Refill (Patient is here today to get refills for her Migraine prevention medication.  She states that she had a bad one on Friday.  It woke her at 6am and she was throwing up with it.  She agrees to call [facility] for Mammogram if the order is placed. She would like the information to take with her for Cologuard. )  on 07/08/2017  HPI:  Migraines- Associated with nausea, vomiting, and photophobia. Topamax 50 mg qhs is helpful for prophylaxis. Imitrex helping to alleviate migraines quickly.   No aura or associated focal neurological defecits.      No current outpatient medications on file prior to visit.   No current facility-administered medications on file prior to visit.     No Known Allergies  Past Medical History:  Diagnosis Date  . Fracture of humerus   . Fracture of lunate, left wrist, closed   . Scoliosis       Family History  Problem Relation Age of Onset  . Diabetes Paternal Grandmother   . Heart disease Paternal Grandmother     Social History   Socioeconomic History  . Marital status: Married    Spouse name: Not on file  . Number of children: Not on file  . Years of education: Not on file  . Highest education level: Not on file  Occupational History  . Not on file  Social Needs  . Financial resource strain: Not on file  . Food insecurity:    Worry: Not on file    Inability: Not on file  . Transportation needs:    Medical: Not on file    Non-medical: Not on file  Tobacco Use  . Smoking status: Never Smoker  . Smokeless tobacco: Never Used  Substance and Sexual Activity  . Alcohol use: Not on file  . Drug use: Not on file  . Sexual activity: Not on file  Lifestyle  . Physical activity:    Days per week: Not on file    Minutes per session: Not on file  .  Stress: Not on file  Relationships  . Social connections:    Talks on phone: Not on file    Gets together: Not on file    Attends religious service: Not on file    Active member of club or organization: Not on file    Attends meetings of clubs or organizations: Not on file    Relationship status: Not on file  . Intimate partner violence:    Fear of current or ex partner: Not on file    Emotionally abused: Not on file    Physically abused: Not on file    Forced sexual activity: Not on file  Other Topics Concern  . Not on file  Social History Narrative   Married, one daughter.   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   The PMH, PSH, Social History, Family History, Medications, and allergies [facility], and have been updated if relevant.   Review of Systems  Neurological: Positive for headaches. Negative for dizziness, tremors, seizures, syncope, facial asymmetry, speech difficulty, weakness, light-headedness and numbness.  Psychiatric/Behavioral: Negative.   All other systems reviewed and are negative.      Objective:    BP 130/84 (BP Location: Left  Arm, Patient Position: Sitting, Cuff Size: Normal)   Pulse 78   Temp 98.5 F (36.9 C) (Oral)   Ht 5\' 3"  (1.6 m)   Wt 155 lb 9.6 oz (70.6 kg)   SpO2 96%   BMI 27.56 kg/m    Physical Exam  General:  Well-developed,well-nourished,in no acute distress; alert,appropriate and cooperative throughout examination Head:  normocephalic and atraumatic.   Eyes:  vision grossly intact, PERRL Ears:  R ear normal and L ear normal externally, TMs clear bilaterally Nose:  no external deformity.   Mouth:  good dentition.   Neck:  No deformities, masses, or tenderness noted. Lungs:  Normal respiratory effort, chest expands symmetrically. Lungs are clear to auscultation, no crackles or wheezes. Heart:  Normal rate and regular rhythm. S1 and S2 normal without gallop, murmur, click, rub or other extra sounds. Msk:  No  deformity or scoliosis noted of thoracic or lumbar spine.   Extremities:  No clubbing, cyanosis, edema, or deformity noted with normal full range of motion of all joints.   Neurologic:  alert & oriented X3 and gait normal.   Skin:  Intact without suspicious lesions or rashes Psych:  Cognition and judgment appear intact. Alert and cooperative with normal attention span and concentration. No apparent delusions, illusions, hallucinations        Assessment & Plan:   Chronic migraine without aura without status migrainosus, not intractable No follow-ups on file.

## 2017-07-08 NOTE — Patient Instructions (Addendum)
Great to see you. Please call [facility] to schedule your mammogram at [phone].  Please schedule your physical on your way out.  Let us know about cologuard.  Try Allegra for your allergies.

## 2017-07-08 NOTE — Assessment & Plan Note (Signed)
Well controlled on current rxs. eRx refills sent. She will schedule appointment for CPX. The patient indicates understanding of these issues and agrees with the plan.

## 2017-09-03 ENCOUNTER — Ambulatory Visit
Admission: RE | Admit: 2017-09-03 | Discharge: 2017-09-03 | Disposition: A | Discharge status: Home / Self Care | Payer: Managed Care, Other (non HMO) | Source: Ambulatory Visit | Attending: Family Medicine | Admitting: Family Medicine

## 2017-09-03 DIAGNOSIS — Z1231 Encounter for screening mammogram for malignant neoplasm of breast: Secondary | ICD-10-CM | POA: Insufficient documentation

## 2017-09-03 DIAGNOSIS — Z1239 Encounter for other screening for malignant neoplasm of breast: Secondary | ICD-10-CM

## 2017-09-11 ENCOUNTER — Other Ambulatory Visit: Payer: Self-pay | Admitting: Family Medicine

## 2017-09-11 DIAGNOSIS — N6310 Unspecified lump in the right breast, unspecified quadrant: Secondary | ICD-10-CM

## 2017-09-11 DIAGNOSIS — R928 Other abnormal and inconclusive findings on diagnostic imaging of breast: Secondary | ICD-10-CM

## 2017-09-12 ENCOUNTER — Encounter: Payer: Self-pay | Admitting: Family Medicine

## 2017-09-12 ENCOUNTER — Ambulatory Visit (INDEPENDENT_AMBULATORY_CARE_PROVIDER_SITE_OTHER): Payer: Managed Care, Other (non HMO) | Admitting: Family Medicine

## 2017-09-12 VITALS — BP 126/84 | HR 84 | Temp 98.7°F | Ht 63.0 in | Wt 154.6 lb

## 2017-09-12 DIAGNOSIS — J069 Acute upper respiratory infection, unspecified: Secondary | ICD-10-CM | POA: Diagnosis not present

## 2017-09-12 MED ORDER — AZITHROMYCIN 250 MG PO TABS: ORAL_TABLET | ORAL | 0 refills | Status: DC

## 2017-09-12 MED ORDER — HYDROCOD POLST-CPM POLST ER 10-8 MG/5ML PO SUER: 5.0000 mL | Freq: Two times a day (BID) | ORAL | 0 refills | Status: DC | PRN

## 2017-09-12 NOTE — Assessment & Plan Note (Signed)
Deteriorated after she finished doxycyline. eRx sent for zpack for persistent bacterial infection/ sinusitis, tussionex as needed for severe cough (discussed sedation precautions). The patient indicates understanding of these issues and agrees with the plan.

## 2017-09-12 NOTE — Progress Notes (Signed)
Subjective:   Patient ID: Tamara Kane, female    DOB: March 13, 1955, 62 y.o.   MRN: [id number]  MAKEL HALLIGAN is a pleasant 62 y.o. year old female who presents to clinic today with Cough (Patient is here today C/O a cough that started on 8.9.19.  In the last week it has become productive with yellow sputum.  She was put on abx on 8.18.19 given via [facility] for 10 days Doxy and Benzonotate and finished last week.  She seemed to get better for a few days and now is struggling again.  Constantly flowing post-nasal drip.  Nasal mucous started to become yellow today.  Right ear has been popping.  She cannot tell if there is sinus involvement or if head hurts from coughin.Left BP from cough.)  on 09/12/2017  HPI:  Cough- started initially on 08/16/17. Was placed on doxycyline and tessalon by [facility] for 10 days.  Finished this last week.  She seemed to get better for a few days and now cough has worsened, now more productive of yellow sputum. +PND.  Current Outpatient Medications on File Prior to Visit  Medication Sig Dispense Refill  . SUMAtriptan (IMITREX) 100 MG tablet Take one tablet by mouth at onset of migraine, may repeat in one hour if migraine not resolved 30 tablet 1  . topiramate (TOPAMAX) 50 MG tablet Take 1 tablet (50 mg total) by mouth at bedtime. 90 tablet 2   No current facility-administered medications on file prior to visit.     No Known Allergies  Past Medical History:  Diagnosis Date  . Fracture of humerus   . Fracture of lunate, left wrist, closed   . Scoliosis     Past Surgical History:  Procedure Laterality Date  . bone fusion for scoliosis     age 71  . BREAST CYST ASPIRATION Left   . left breast mass aspiration  2009   Dr. Lemar Livings    Family History  Problem Relation Age of Onset  . Diabetes Paternal Grandmother   . Heart disease Paternal Grandmother   . Breast cancer Neg Hx     Social History   Socioeconomic History  . Marital status: Married    Spouse  name: Not on file  . Number of children: Not on file  . Years of education: Not on file  . Highest education level: Not on file  Occupational History  . Not on file  Social Needs  . Financial resource strain: Not on file  . Food insecurity:    Worry: Not on file    Inability: Not on file  . Transportation needs:    Medical: Not on file    Non-medical: Not on file  Tobacco Use  . Smoking status: Never Smoker  . Smokeless tobacco: Never Used  Substance and Sexual Activity  . Alcohol use: Not on file  . Drug use: Not on file  . Sexual activity: Not on file  Lifestyle  . Physical activity:    Days per week: Not on file    Minutes per session: Not on file  . Stress: Not on file  Relationships  . Social connections:    Talks on phone: Not on file    Gets together: Not on file    Attends religious service: Not on file    Active member of club or organization: Not on file    Attends meetings of clubs or organizations: Not on file    Relationship status: Not  on file  . Intimate partner violence:    Fear of current or ex partner: Not on file    Emotionally abused: Not on file    Physically abused: Not on file    Forced sexual activity: Not on file  Other Topics Concern  . Not on file  Social History Narrative   Married, one daughter.   Works for [employer] for [employer].   The PMH, PSH, Social History, Family History, Medications, and allergies have been reviewed in [facility], and have been updated if relevant.   Review of Systems  Constitutional: Negative.   HENT: Positive for congestion, ear pain, postnasal drip, rhinorrhea, sinus pressure, sinus pain and sore throat. Negative for ear discharge and facial swelling.   Respiratory: Positive for cough. Negative for choking, shortness of breath, wheezing and stridor.   Cardiovascular: Negative.   Gastrointestinal: Negative.   Endocrine: Negative.   Genitourinary: Negative.   Musculoskeletal: Negative.     Allergic/Immunologic: Negative.   Neurological: Negative.   Hematological: Negative.   Psychiatric/Behavioral: Negative.   All other systems reviewed and are negative.      Objective:    BP 126/84 (BP Location: Left Arm, Patient Position: Sitting, Cuff Size: Normal)   Pulse 84   Temp 98.7 F (37.1 C) (Oral)   Ht 5\' 3"  (1.6 m)   Wt 154 lb 9.6 oz (70.1 kg)   SpO2 96%   BMI 27.39 kg/m    Physical Exam  Constitutional: She is oriented to person, place, and time. She appears well-developed and well-nourished. No distress.  HENT:  Head: Normocephalic and atraumatic.  Right Ear: Hearing and tympanic membrane normal.  Left Ear: Hearing normal. A middle ear effusion is present.  Nose: Right sinus exhibits maxillary sinus tenderness and frontal sinus tenderness. Left sinus exhibits frontal sinus tenderness.  Cardiovascular: Normal rate and regular rhythm.  Pulmonary/Chest: Effort normal and breath sounds normal. No stridor. No respiratory distress. She has no wheezes. She has no rales. She exhibits no tenderness.  +harsh cough  Musculoskeletal: Normal range of motion.  Neurological: She is alert and oriented to person, place, and time. No cranial nerve deficit.  Skin: Skin is warm and dry. She is not diaphoretic.  Psychiatric: She has a normal mood and affect. Her behavior is normal. Judgment and thought content normal.  Nursing note and vitals reviewed.         Assessment & Plan:   Upper respiratory tract infection, unspecified type No follow-ups on file.

## 2017-09-12 NOTE — Patient Instructions (Signed)
Great to see you.  Take zpack as directed. Tussionex as needed for cough at night.

## 2017-09-19 ENCOUNTER — Ambulatory Visit
Admission: RE | Admit: 2017-09-19 | Discharge: 2017-09-19 | Disposition: A | Discharge status: Home / Self Care | Payer: Managed Care, Other (non HMO) | Source: Ambulatory Visit | Attending: Family Medicine | Admitting: Family Medicine

## 2017-09-19 DIAGNOSIS — R928 Other abnormal and inconclusive findings on diagnostic imaging of breast: Secondary | ICD-10-CM

## 2017-09-19 DIAGNOSIS — N6310 Unspecified lump in the right breast, unspecified quadrant: Secondary | ICD-10-CM

## 2018-02-10 ENCOUNTER — Other Ambulatory Visit: Payer: Self-pay | Admitting: Family Medicine

## 2018-06-02 ENCOUNTER — Other Ambulatory Visit: Payer: Self-pay | Admitting: Family Medicine

## 2018-09-22 ENCOUNTER — Other Ambulatory Visit: Payer: Self-pay | Admitting: Family Medicine

## 2018-09-23 NOTE — Telephone Encounter (Signed)
Dr. Deborra Medina please advise last OV 07/08/2017 330 1 refill. No upcoming appointment.

## 2018-11-10 ENCOUNTER — Other Ambulatory Visit: Payer: Self-pay | Admitting: Family Medicine

## 2018-11-10 NOTE — Telephone Encounter (Signed)
Last fill 06/03/18  #90/0 Last OV 09/12/17 Pt need a follow up office visit before any other refills.

## 2019-12-09 ENCOUNTER — Encounter: Payer: Self-pay | Admitting: Internal Medicine

## 2019-12-09 ENCOUNTER — Other Ambulatory Visit: Payer: Self-pay

## 2019-12-09 ENCOUNTER — Ambulatory Visit (INDEPENDENT_AMBULATORY_CARE_PROVIDER_SITE_OTHER): Payer: Managed Care, Other (non HMO) | Admitting: Internal Medicine

## 2019-12-09 DIAGNOSIS — G43709 Chronic migraine without aura, not intractable, without status migrainosus: Secondary | ICD-10-CM | POA: Diagnosis not present

## 2019-12-09 MED ORDER — TOPIRAMATE 50 MG PO TABS: 50.0000 mg | ORAL_TABLET | Freq: Every day | ORAL | 3 refills | Status: DC

## 2019-12-09 MED ORDER — SUMATRIPTAN SUCCINATE 100 MG PO TABS: ORAL_TABLET | ORAL | 3 refills | Status: DC

## 2019-12-09 NOTE — Progress Notes (Signed)
HPI  Pt presents to the clinic today to establish care and for management of the conditions listed below. She is transferring care from Dr. Dayton Martes.  Migraines: Triggered by temperature changes, envirnmental factors. These occur about 6-7 times per year. Managed on Topamax and Imitrex. She does not follow with neurology.  Flu: 10/2017 Tetanus: > 10 years ago Covid: never Zostovax: never Shingrix: never Pap Smear: > 5 years ago Mammogram: > 2 years ago Bone Density: never Colon Screening: never  Past Medical History:  Diagnosis Date  . Fracture of humerus   . Fracture of lunate, left wrist, closed   . Scoliosis     Current Outpatient Medications  Medication Sig Dispense Refill  . SUMAtriptan (IMITREX) 100 MG tablet TAKE ONE TABLET BY MOUTH AT ONSET OF MIGRAINE, MAY  REPEAT IN ONE HOUR IF  MIGRAINE NOT RESOLVED 27 tablet 0  . topiramate (TOPAMAX) 50 MG tablet TAKE 1 TABLET BY MOUTH AT  BEDTIME 90 tablet 3   No current facility-administered medications for this visit.    No Known Allergies  Family History  Problem Relation Age of Onset  . Diabetes Paternal Grandmother   . Heart disease Paternal Grandmother   . Breast cancer Neg Hx     Social History   Socioeconomic History  . Marital status: Married    Spouse name: Not on file  . Number of children: Not on file  . Years of education: Not on file  . Highest education level: Not on file  Occupational History  . Not on file  Tobacco Use  . Smoking status: Never Smoker  . Smokeless tobacco: Never Used  Substance and Sexual Activity  . Alcohol use: Not on file  . Drug use: Not on file  . Sexual activity: Not on file  Other Topics Concern  . Not on file  Social History Narrative   Married, one daughter.   Works for [employer] for [employer].   Social Determinants of Health   Financial Resource Strain:   . Difficulty of Paying Living Expenses: Not on file  Food Insecurity:   . Worried About Brewing technologist in the Last Year: Not on file  . Ran Out of Food in the Last Year: Not on file  Transportation Needs:   . Lack of Transportation (Medical): Not on file  . Lack of Transportation (Non-Medical): Not on file  Physical Activity:   . Days of Exercise per Week: Not on file  . Minutes of Exercise per Session: Not on file  Stress:   . Feeling of Stress : Not on file  Social Connections:   . Frequency of Communication with Friends and Family: Not on file  . Frequency of Social Gatherings with Friends and Family: Not on file  . Attends Religious Services: Not on file  . Active Member of Clubs or Organizations: Not on file  . Attends Banker Meetings: Not on file  . Marital Status: Not on file  Intimate Partner Violence:   . Fear of Current or Ex-Partner: Not on file  . Emotionally Abused: Not on file  . Physically Abused: Not on file  . Sexually Abused: Not on file    ROS:  Constitutional: Pt reports intermittent headaches. Denies fever, malaise, fatigue, or abrupt weight changes.  HEENT: Denies eye pain, eye redness, ear pain, ringing in the ears, wax buildup, runny nose, nasal congestion, bloody nose, or sore throat. Respiratory: Denies difficulty breathing, shortness of breath, cough or sputum production.  Cardiovascular: Denies chest pain, chest tightness, palpitations or swelling in the hands or feet.  Neurological: Denies dizziness, difficulty with memory, difficulty with speech or problems with balance and coordination.  Psych: Denies anxiety, depression, SI/HI.  No other specific complaints in a complete review of systems (except as listed in HPI above).  PE:  BP 126/84   Pulse 84   Temp (!) 97.1 F (36.2 C) (Temporal)   Wt 157 lb (71.2 kg)   SpO2 97%   BMI 27.81 kg/m   Wt Readings from Last 3 Encounters:  09/12/17 154 lb 9.6 oz (70.1 kg)  07/08/17 155 lb 9.6 oz (70.6 kg)  12/15/15 158 lb 8 oz (71.9 kg)    General: Appears her stated age, well  developed, well nourished in NAD. HEENT: Head: normal shape and size; Eyes: EOMs intact; Cardiovascular: Normal rate. Pulmonary/Chest: Normal effort. Musculoskeletal: swelling. No difficulty with gait.  Neurological: Alert and oriented.  Psychiatric: Mood and affect normal. Behavior is normal. Judgment and thought content normal.     BMET    Component Value Date/Time   NA 140 02/24/2015 1244   K 4.1 02/24/2015 1244   CL 103 02/24/2015 1244   CO2 20 02/24/2015 1244   GLUCOSE 82 02/24/2015 1244   BUN 15 02/24/2015 1244   CREATININE 0.65 02/24/2015 1244   CALCIUM 9.5 02/24/2015 1244   GFRNONAA 99 02/24/2015 1244   GFRAA 114 02/24/2015 1244    Lipid Panel     Component Value Date/Time   CHOL 210 (H) 02/24/2015 1244   TRIG 43 02/24/2015 1244   HDL 79 02/24/2015 1244   CHOLHDL 2.7 02/24/2015 1244   LDLCALC 122 (H) 02/24/2015 1244    CBC    Component Value Date/Time   WBC 6.4 02/24/2015 1244   RBC 4.36 02/24/2015 1244   HGB 13.5 02/24/2015 1244   HCT 39.3 02/24/2015 1244   PLT 225 02/24/2015 1244   MCV 90 02/24/2015 1244   MCH 31.0 02/24/2015 1244   MCHC 34.4 02/24/2015 1244   RDW 12.9 02/24/2015 1244   LYMPHSABS 1.7 02/24/2015 1244   EOSABS 0.3 02/24/2015 1244   BASOSABS 0.0 02/24/2015 1244    Hgb A1C No results found for: HGBA1C   Assessment and Plan:   Nicki Reaper, NP This visit occurred during the SARS-CoV-2 public health emergency.  Safety protocols were in place, including screening questions prior to the visit, additional usage of staff PPE, and extensive cleaning of exam room while observing appropriate contact time as indicated for disinfecting solutions.

## 2019-12-10 ENCOUNTER — Encounter: Payer: Self-pay | Admitting: Internal Medicine

## 2019-12-10 NOTE — Assessment & Plan Note (Signed)
Stable on Topamax and Imitrex Will monitor

## 2019-12-10 NOTE — Patient Instructions (Signed)

## 2020-01-21 ENCOUNTER — Ambulatory Visit: Payer: Managed Care, Other (non HMO) | Admitting: Internal Medicine

## 2020-02-15 ENCOUNTER — Ambulatory Visit (INDEPENDENT_AMBULATORY_CARE_PROVIDER_SITE_OTHER): Payer: Managed Care, Other (non HMO) | Admitting: Internal Medicine

## 2020-02-15 ENCOUNTER — Other Ambulatory Visit: Payer: Self-pay

## 2020-02-15 ENCOUNTER — Encounter: Payer: Self-pay | Admitting: Internal Medicine

## 2020-02-15 VITALS — BP 124/80 | HR 79 | Temp 97.8°F | Ht 63.0 in | Wt 158.0 lb

## 2020-02-15 DIAGNOSIS — Z0001 Encounter for general adult medical examination with abnormal findings: Secondary | ICD-10-CM | POA: Diagnosis not present

## 2020-02-15 DIAGNOSIS — G43709 Chronic migraine without aura, not intractable, without status migrainosus: Secondary | ICD-10-CM | POA: Diagnosis not present

## 2020-02-15 DIAGNOSIS — Z124 Encounter for screening for malignant neoplasm of cervix: Secondary | ICD-10-CM

## 2020-02-15 NOTE — Assessment & Plan Note (Signed)
Continue Topamax and Imitrex as prescribed

## 2020-02-15 NOTE — Addendum Note (Signed)
Addended by: Roena Malady on: 02/15/2020 10:39 AM   Modules accepted: Orders

## 2020-02-15 NOTE — Patient Instructions (Signed)

## 2020-02-15 NOTE — Progress Notes (Addendum)
Subjective:    Patient ID: Tamara Kane, female    DOB: 1955-07-15, 65 y.o.   MRN: [id number]  HPI  Patient presents the clinic today for her annual exam.  Migraines: She has only had 1 in the last 2 months. She is taking Topamax and Imitrex as prescribed with good relief of symptoms.  She does not follow with neurology.  Flu: 10/2017 Tetanus: > 10 years ago Covid: never Shingrix:never Pap smear: > 5 years ago Mammogram: 09/2017 Bone density: never Colon screening: never Vision screening: as needed Dentist: biannually  Diet: She does eat meat. She consumes fruits and veggies daily. She does eat some fried foods. She drinks Dt. Dr. Malachi Bonds and water. Exercise: Walking  Review of Systems      Past Medical History:  Diagnosis Date  . Fracture of humerus   . Fracture of lunate, left wrist, closed   . Scoliosis     Current Outpatient Medications  Medication Sig Dispense Refill  . cetirizine (ZYRTEC) 10 MG tablet Take 10 mg by mouth daily.    . Probiotic Product (PROBIOTIC DAILY PO) Take by mouth.    . SUMAtriptan (IMITREX) 100 MG tablet May repeat in 2 hours if headache persists or recurs. 30 tablet 3  . topiramate (TOPAMAX) 50 MG tablet Take 1 tablet (50 mg total) by mouth at bedtime. 90 tablet 3   No current facility-administered medications for this visit.    No Known Allergies  Family History  Problem Relation Age of Onset  . Diabetes Paternal Grandmother   . Heart disease Paternal Grandmother   . Breast cancer Neg Hx     Social History   Socioeconomic History  . Marital status: Married    Spouse name: Not on file  . Number of children: Not on file  . Years of education: Not on file  . Highest education level: Not on file  Occupational History  . Not on file  Tobacco Use  . Smoking status: Never Smoker  . Smokeless tobacco: Never Used  Substance and Sexual Activity  . Alcohol use: Never  . Drug use: Never  . Sexual activity: Not on file  Other  Topics Concern  . Not on file  Social History Narrative   Married, one daughter.   Works for [employer] for [employer].   Social Determinants of Radio broadcast assistant Strain: Not on file  Food Insecurity: Not on file  Transportation Needs: Not on file  Physical Activity: Not on file  Stress: Not on file  Social Connections: Not on file  Intimate Partner Violence: Not on file     Constitutional: Patient reports intermittent headaches.  Denies fever, malaise, fatigue, or abrupt weight changes.  HEENT: Denies eye pain, eye redness, ear pain, ringing in the ears, wax buildup, runny nose, nasal congestion, bloody nose, or sore throat. Respiratory: Denies difficulty breathing, shortness of breath, cough or sputum production.   Cardiovascular: Denies chest pain, chest tightness, palpitations or swelling in the hands or feet.  Gastrointestinal: Denies abdominal pain, bloating, constipation, diarrhea or blood in the stool.  GU: Denies urgency, frequency, pain with urination, burning sensation, blood in urine, odor or discharge. Musculoskeletal: Denies decrease in range of motion, difficulty with gait, muscle pain or joint pain and swelling.  Skin: Denies redness, rashes, lesions or ulcercations.  Neurological: Denies dizziness, difficulty with memory, difficulty with speech or problems with balance and coordination.  Psych: Denies anxiety, depression, SI/HI.  No other specific complaints in a  complete review of systems (except as listed in HPI above).  Objective:   Physical Exam BP 124/80   Pulse 79   Temp 97.8 F (36.6 C) (Temporal)   Ht _0  (1.6 m)   Wt 158 lb (71.7 kg)   SpO2 98%   BMI 27.99 kg/m   Wt Readings from Last 3 Encounters:  12/09/19 157 lb (71.2 kg)  09/12/17 154 lb 9.6 oz (70.1 kg)  07/08/17 155 lb 9.6 oz (70.6 kg)    General: Appears her stated age, well developed, well nourished in NAD. Skin: Warm, dry and intact. No rashes noted. HEENT: Head:  normal shape and size; Eyes: sclera white, no icterus, conjunctiva pink, PERRLA and EOMs intact;  Neck:  Neck supple, trachea midline. No masses, lumps or thyromegaly present.  Cardiovascular: Normal rate and rhythm. S1,S2 noted.  No murmur, rubs or gallops noted. No JVD or BLE edema. No carotid bruits noted. Pulmonary/Chest: Normal effort and positive vesicular breath sounds. No respiratory distress. No wheezes, rales or ronchi noted.  Abdomen: Soft and nontender. Normal bowel sounds. No distention or masses noted. Liver, spleen and kidneys non palpable. Pelvic: Normal female anatomy. Cervix without lesion or mass. No CMT. Adnexa non palpable.t Musculoskeletal: Scoliosis noted. Strength 5/5 BUE/BLE. No difficulty with gait.  Neurological: Alert and oriented. Cranial nerves II-XII grossly intact. Coordination normal.  Psychiatric: Mood and affect normal. Behavior is normal. Judgment and thought content normal.   BMET    Component Value Date/Time   NA 140 02/24/2015 1244   K 4.1 02/24/2015 1244   CL 103 02/24/2015 1244   CO2 20 02/24/2015 1244   GLUCOSE 82 02/24/2015 1244   BUN 15 02/24/2015 1244   CREATININE 0.65 02/24/2015 1244   CALCIUM 9.5 02/24/2015 1244   GFRNONAA 99 02/24/2015 1244   GFRAA 114 02/24/2015 1244    Lipid Panel     Component Value Date/Time   CHOL 210 (H) 02/24/2015 1244   TRIG 43 02/24/2015 1244   HDL 79 02/24/2015 1244   CHOLHDL 2.7 02/24/2015 1244   LDLCALC 122 (H) 02/24/2015 1244    CBC    Component Value Date/Time   WBC 6.4 02/24/2015 1244   RBC 4.36 02/24/2015 1244   HGB 13.5 02/24/2015 1244   HCT 39.3 02/24/2015 1244   PLT 225 02/24/2015 1244   MCV 90 02/24/2015 1244   MCH 31.0 02/24/2015 1244   MCHC 34.4 02/24/2015 1244   RDW 12.9 02/24/2015 1244   LYMPHSABS 1.7 02/24/2015 1244   EOSABS 0.3 02/24/2015 1244   BASOSABS 0.0 02/24/2015 1244    Hgb A1C No results found for: HGBA1C          Assessment & Plan:   Preventative Health  Maintenance:  She declines flu shot She declines tetanus shot Encouraged her to get a Covid vaccine Encouraged her to get a Shingrix vaccine Pap smear today- she declines STD screening She declines mammogram  She declines bone density She declines colonoscopy, Cologuard but IFOB ordered Encouraged her to consume a balanced diet exercise regimen Advised her to see an eye doctor and dentist annually We will check CBC, c-Met, lipid profile today  RTC in 1 year, sooner if needed Webb Silversmith, NP This visit occurred during the SARS-CoV-2 public health emergency.  Safety protocols were in place, including screening questions prior to the visit, additional usage of staff PPE, and extensive cleaning of exam room while observing appropriate contact time as indicated for disinfecting solutions.

## 2020-02-16 LAB — LIPID PANEL
Chol/HDL Ratio: 3.1 ratio (ref 0.0–4.4)
Cholesterol, Total: 211 mg/dL — ABNORMAL HIGH (ref 100–199)
HDL: 67 mg/dL (ref >39)
LDL Chol Calc (NIH): 130 mg/dL — ABNORMAL HIGH (ref 0–99)
Triglycerides: 76 mg/dL (ref 0–149)
VLDL Cholesterol Cal: 14 mg/dL (ref 5–40)

## 2020-02-16 LAB — COMPREHENSIVE METABOLIC PANEL
ALT: 16 IU/L (ref 0–32)
AST: 18 IU/L (ref 0–40)
Albumin/Globulin Ratio: 1.9 (ref 1.2–2.2)
Albumin: 4.4 g/dL (ref 3.8–4.8)
Alkaline Phosphatase: 97 IU/L (ref 44–121)
BUN/Creatinine Ratio: 15 (ref 12–28)
BUN: 13 mg/dL (ref 8–27)
Bilirubin Total: 0.3 mg/dL (ref 0.0–1.2)
CO2: 20 mmol/L (ref 20–29)
Calcium: 9.2 mg/dL (ref 8.7–10.3)
Chloride: 107 mmol/L — ABNORMAL HIGH (ref 96–106)
Creatinine, Ser: 0.85 mg/dL (ref 0.57–1.00)
GFR calc Af Amer: 85 mL/min/{1.73_m2} (ref >59)
GFR calc non Af Amer: 74 mL/min/{1.73_m2} (ref >59)
Globulin, Total: 2.3 g/dL (ref 1.5–4.5)
Glucose: 99 mg/dL (ref 65–99)
Potassium: 4 mmol/L (ref 3.5–5.2)
Sodium: 141 mmol/L (ref 134–144)
Total Protein: 6.7 g/dL (ref 6.0–8.5)

## 2020-02-16 LAB — CBC
Hematocrit: 40.9 % (ref 34.0–46.6)
Hemoglobin: 13.8 g/dL (ref 11.1–15.9)
MCH: 30.5 pg (ref 26.6–33.0)
MCHC: 33.7 g/dL (ref 31.5–35.7)
MCV: 91 fL (ref 79–97)
Platelets: 223 10*3/uL (ref 150–450)
RBC: 4.52 x10E6/uL (ref 3.77–5.28)
RDW: 12.5 % (ref 11.7–15.4)
WBC: 7.2 10*3/uL (ref 3.4–10.8)

## 2020-02-17 LAB — PAP LB (LIQUID-BASED)

## 2020-03-13 IMAGING — US US BREAST*R* LIMITED INC AXILLA
1 series · 5 of 5 positions shown · non-contrast
Comparison: Previous exam(s).

CLINICAL DATA: 61-year-old female for further evaluation of
possible RIGHT breast mass on screening mammogram.

EXAM:
DIGITAL DIAGNOSTIC RIGHT MAMMOGRAM WITH CAD
ULTRASOUND RIGHT BREAST

[Series 1: us breast*right* limited inc axilla · 0.05mm/px · 5 of 5 slices shown]
[im 1/5]
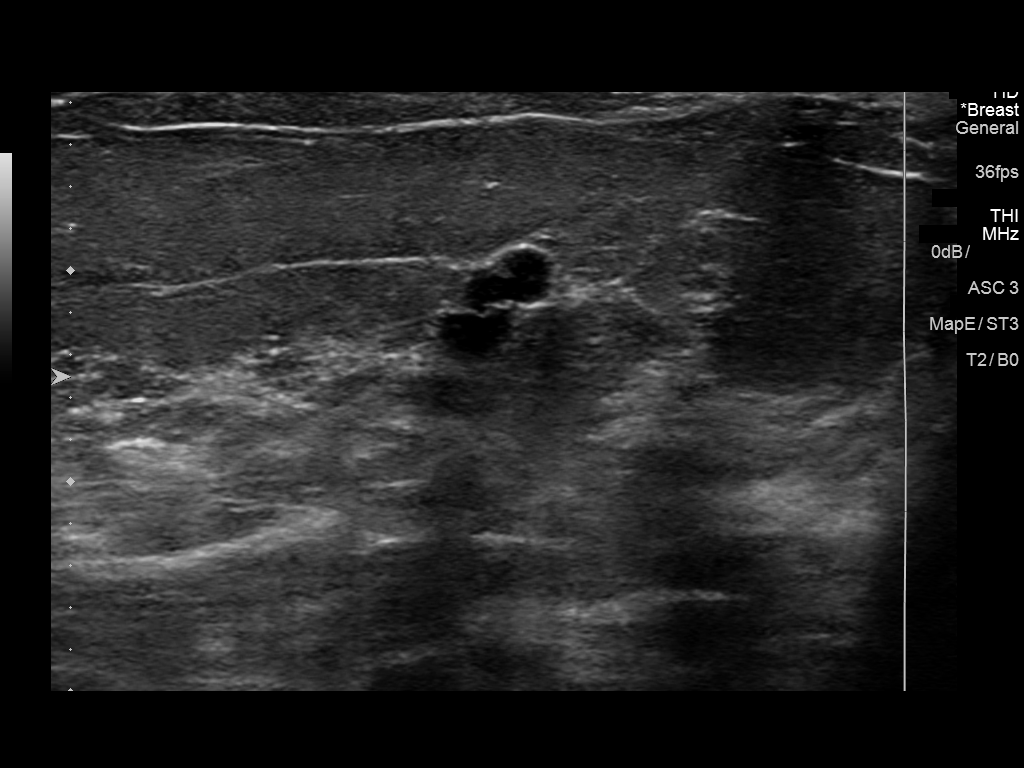
[im 2/5]
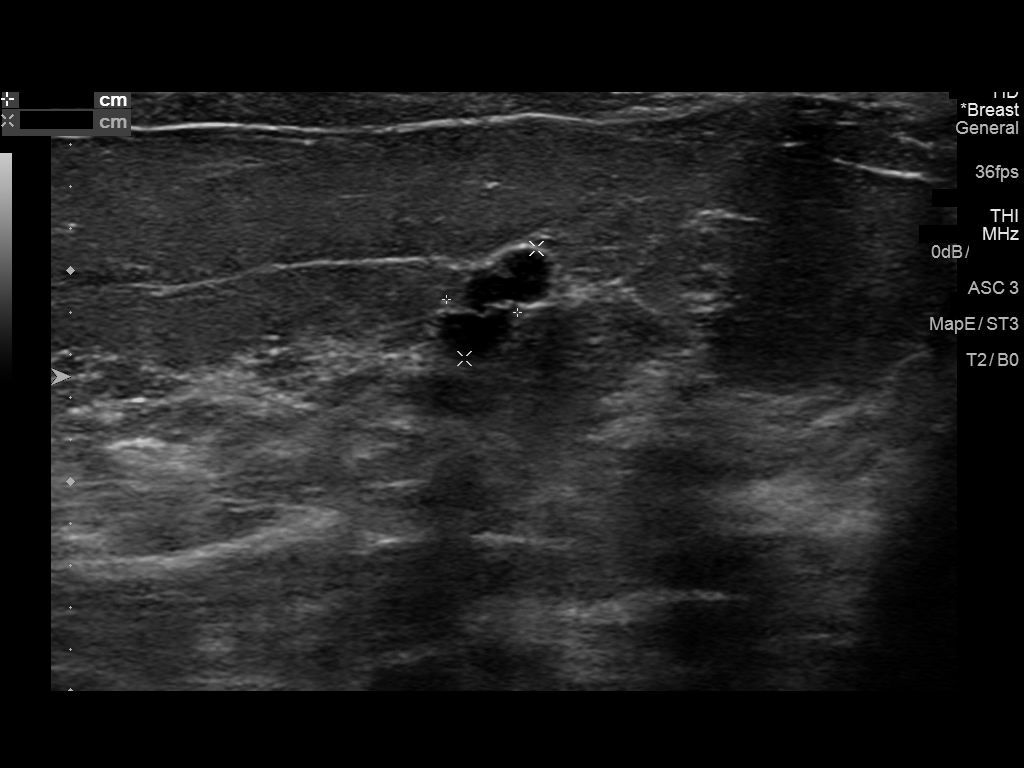
[im 3/5]
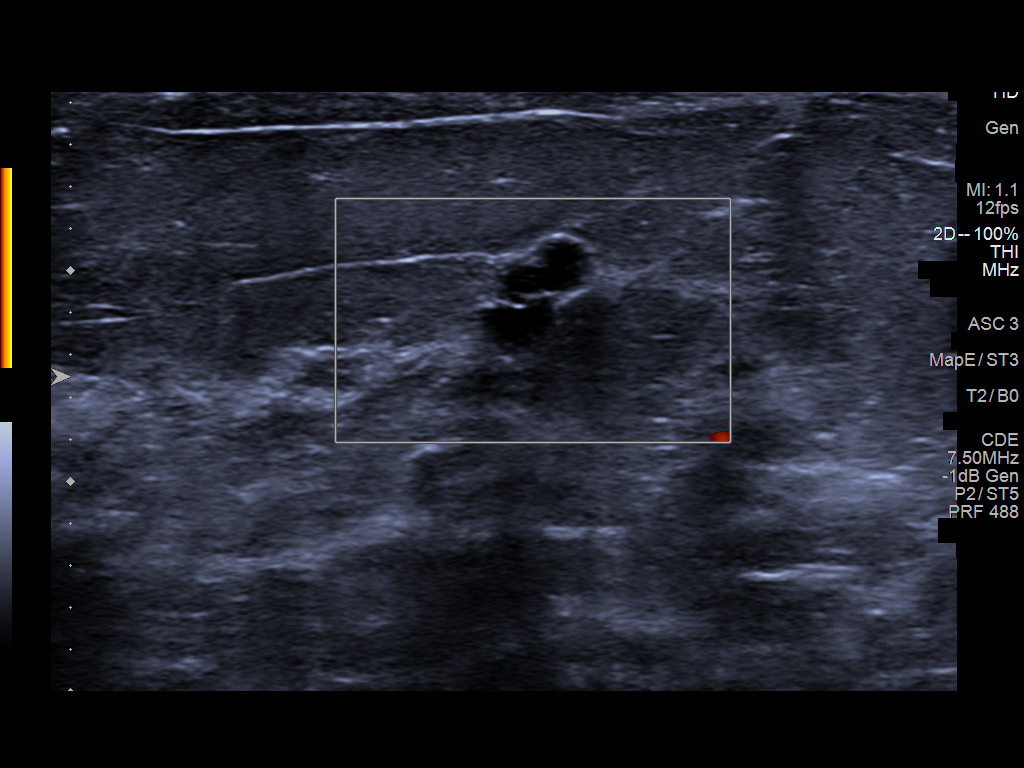
[im 4/5]
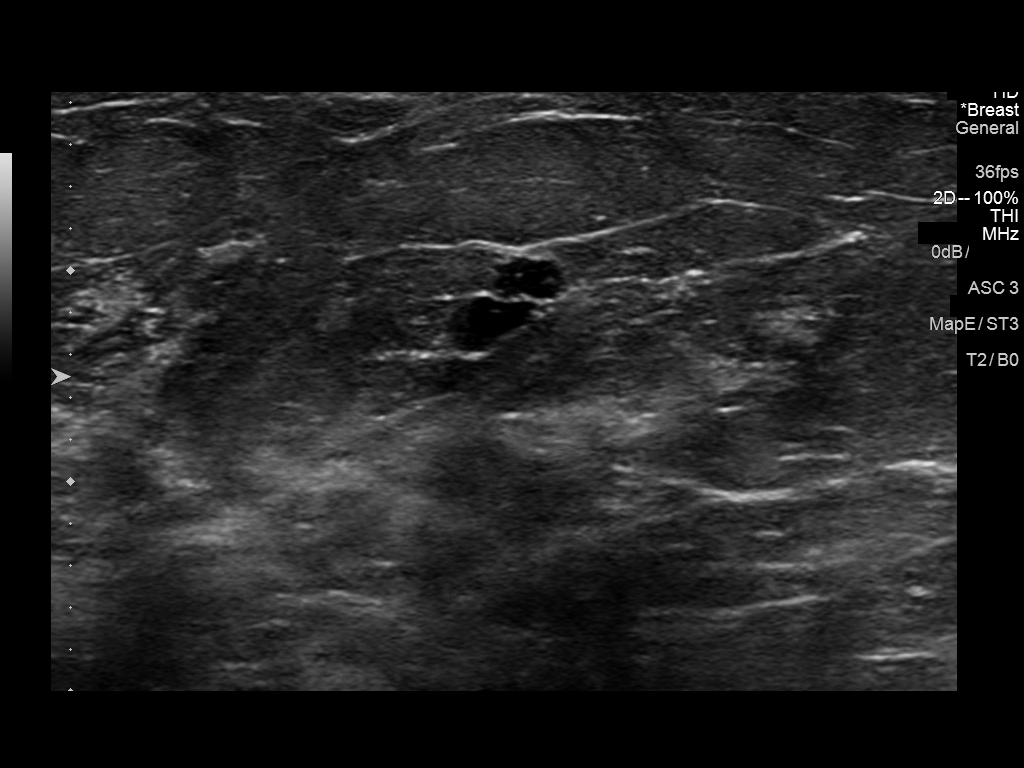
[im 5/5]
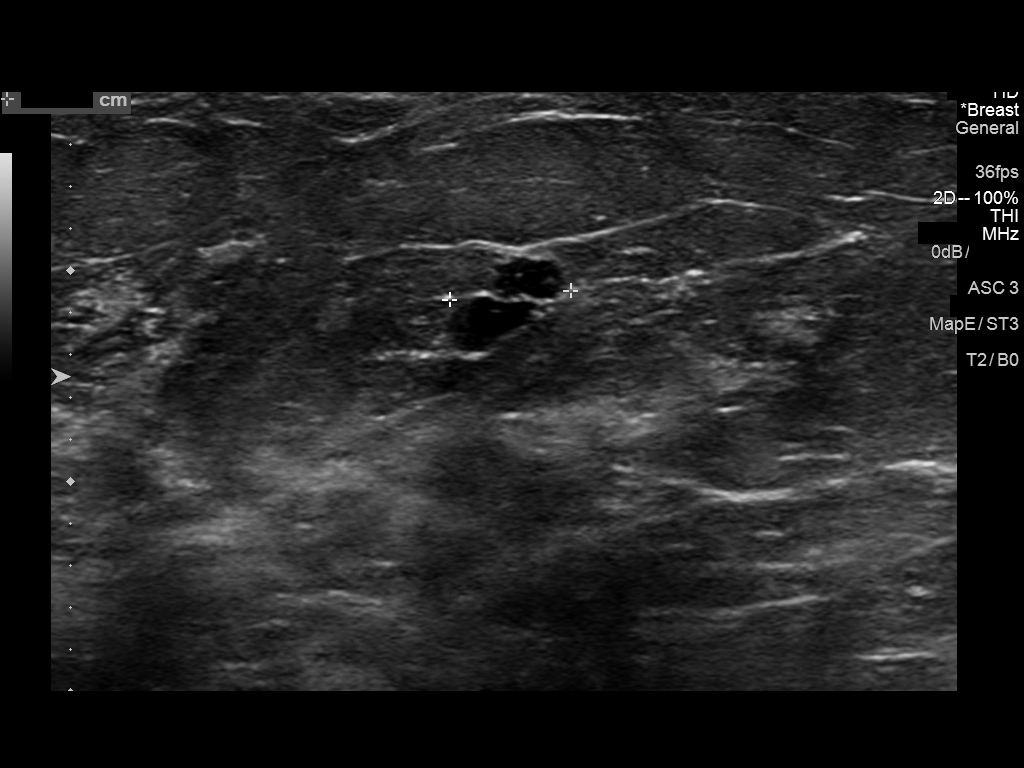

[5 of 5 positions shown; findings below may reference images not displayed]

ACR Breast Density Category b: There are scattered areas of
fibroglandular density.
FINDINGS: 2D/3D spot compression views of the RIGHT breast demonstrate a
persistent circumscribed oval mass within the UPPER RIGHT breast.

Targeted ultrasound is performed, showing a 6 x 3 x 6 mm benign
minimally complicated cyst at the 12 o'clock position of the RIGHT
breast 2 cm from the nipple, corresponding to the screening study
finding.
IMPRESSION: Benign cyst within the UPPER RIGHT breast corresponding to the
screening study finding.

RECOMMENDATION:
Bilateral screening mammogram in 1 year

I have discussed the findings and recommendations with the patient.
Results were also provided in writing at the conclusion of the
visit. If applicable, a reminder letter will be sent to the patient
regarding the next appointment.

BI-RADS CATEGORY  2: Benign.

## 2020-10-04 ENCOUNTER — Other Ambulatory Visit: Payer: Self-pay | Admitting: Internal Medicine

## 2020-10-05 NOTE — Telephone Encounter (Signed)
Refill Topamax Last office visit 02/15/20 Last refill 12/09/19 #90/3 No upcoming appointment scheduled

## 2020-12-28 ENCOUNTER — Other Ambulatory Visit: Payer: Self-pay | Admitting: Family

## 2021-03-02 ENCOUNTER — Ambulatory Visit (INDEPENDENT_AMBULATORY_CARE_PROVIDER_SITE_OTHER): Payer: Managed Care, Other (non HMO) | Admitting: Internal Medicine

## 2021-03-02 ENCOUNTER — Encounter: Payer: Self-pay | Admitting: Internal Medicine

## 2021-03-02 ENCOUNTER — Other Ambulatory Visit: Payer: Self-pay

## 2021-03-02 VITALS — BP 113/64 | HR 80 | Temp 96.9°F | Ht 64.0 in | Wt 137.0 lb

## 2021-03-02 DIAGNOSIS — E7800 Pure hypercholesterolemia, unspecified: Secondary | ICD-10-CM

## 2021-03-02 DIAGNOSIS — Z780 Asymptomatic menopausal state: Secondary | ICD-10-CM | POA: Diagnosis not present

## 2021-03-02 DIAGNOSIS — M4134 Thoracogenic scoliosis, thoracic region: Secondary | ICD-10-CM

## 2021-03-02 DIAGNOSIS — Z1231 Encounter for screening mammogram for malignant neoplasm of breast: Secondary | ICD-10-CM | POA: Diagnosis not present

## 2021-03-02 DIAGNOSIS — E785 Hyperlipidemia, unspecified: Secondary | ICD-10-CM | POA: Insufficient documentation

## 2021-03-02 DIAGNOSIS — Z0001 Encounter for general adult medical examination with abnormal findings: Secondary | ICD-10-CM

## 2021-03-02 DIAGNOSIS — G43709 Chronic migraine without aura, not intractable, without status migrainosus: Secondary | ICD-10-CM

## 2021-03-02 DIAGNOSIS — M419 Scoliosis, unspecified: Secondary | ICD-10-CM | POA: Insufficient documentation

## 2021-03-02 MED ORDER — SUMATRIPTAN SUCCINATE 100 MG PO TABS: ORAL_TABLET | ORAL | 3 refills | Status: AC

## 2021-03-02 MED ORDER — TOPIRAMATE 50 MG PO TABS: 50.0000 mg | ORAL_TABLET | Freq: Every day | ORAL | 3 refills | Status: DC

## 2021-03-02 NOTE — Progress Notes (Signed)
Subjective:    Patient ID: Tamara Kane, female    DOB: Dec 09, 1955, 66 y.o.   MRN: [id number]  HPI  Patient presents the clinic today for her annual exam.  Migraines: Triggered by weather.  These occur 1 x 2-3 months.  She is taking Topamax and Imitrex as prescribed.  She does not follow with neurology.  HLD: Her last LDL was 130, triglycerides 76, 02/2020.  She is not taking any cholesterol-lowering medication at this time.  She does not consume a low-fat diet.  Flu: 10/2017 Tetanus: 09/2006 COVID: never Shingrix: never Pap smear: 02/2020 Mammogram: 10/2017 Bone density: never Colon screening: Vision screening: as needed Dentist: biannually  Diet: She does eat meat. She does eat fruits and veggies. She does eat some fried foods. She drinks mostly Dt. Dr. Malachi Bonds, water. Exercise: Walking  Review of Systems     Past Medical History:  Diagnosis Date   Fracture of humerus    Fracture of lunate, left wrist, closed    Scoliosis     Current Outpatient Medications  Medication Sig Dispense Refill   cetirizine (ZYRTEC) 10 MG tablet Take 10 mg by mouth daily.     Probiotic Product (PROBIOTIC DAILY PO) Take by mouth.     SUMAtriptan (IMITREX) 100 MG tablet May repeat in 2 hours if headache persists or recurs. 30 tablet 3   topiramate (TOPAMAX) 50 MG tablet TAKE 1 TABLET BY MOUTH AT  BEDTIME 90 tablet 0   No current facility-administered medications for this visit.    No Known Allergies  Family History  Problem Relation Age of Onset   Diabetes Paternal Grandmother    Heart disease Paternal Grandmother    Breast cancer Neg Hx     Social History   Socioeconomic History   Marital status: Married    Spouse Kane: Not on file   Number of children: Not on file   Years of education: Not on file   Highest education level: Not on file  Occupational History   Not on file  Tobacco Use   Smoking status: Never   Smokeless tobacco: Never  Substance and Sexual Activity    Alcohol use: Never   Drug use: Never   Sexual activity: Not on file  Other Topics Concern   Not on file  Social History Narrative   Married, one daughter.   Works for [employer] for [employer].   Social Determinants of Radio broadcast assistant Strain: Not on file  Food Insecurity: Not on file  Transportation Needs: Not on file  Physical Activity: Not on file  Stress: Not on file  Social Connections: Not on file  Intimate Partner Violence: Not on file     Constitutional: Patient reports intermittent headaches.  Denies fever, malaise, fatigue, or abrupt weight changes.  HEENT: Denies eye pain, eye redness, ear pain, ringing in the ears, wax buildup, runny nose, nasal congestion, bloody nose, or sore throat. Respiratory: Denies difficulty breathing, shortness of breath, cough or sputum production.   Cardiovascular: Denies chest pain, chest tightness, palpitations or swelling in the hands or feet.  Gastrointestinal: Denies abdominal pain, bloating, constipation, diarrhea or blood in the stool.  GU: Patient reports vaginal dryness.  Denies urgency, frequency, pain with urination, burning sensation, blood in urine, odor or discharge. Musculoskeletal: Denies decrease in range of motion, difficulty with gait, muscle pain or joint pain and swelling.  Skin: Denies redness, rashes, lesions or ulcercations.  Neurological: Denies dizziness, difficulty with memory, difficulty with  speech or problems with balance and coordination.  Psych: Denies anxiety, depression, SI/HI.  No other specific complaints in a complete review of systems (except as listed in HPI above).  Objective:   Physical Exam  BP 113/64 (BP Location: Left Arm, Patient Position: Sitting, Cuff Size: Large)    Pulse 80    Temp (!) 96.9 F (36.1 C) (Temporal)    Ht 5' 4" (1.626 m)    Wt 137 lb (62.1 kg)    SpO2 99%    BMI 23.52 kg/m   Wt Readings from Last 3 Encounters:  02/15/20 158 lb (71.7 kg)  12/09/19 157 lb  (71.2 kg)  09/12/17 154 lb 9.6 oz (70.1 kg)    General: Appears her stated age, well developed, well nourished in NAD. Skin: Warm, dry and intact.  HEENT: Head: normal shape and size; Eyes: sclera white and EOMs intact;  Neck:  Neck supple, trachea midline. No masses, lumps or thyromegaly present.  Cardiovascular: Normal rate and rhythm. S1,S2 noted.  No murmur, rubs or gallops noted. No JVD or BLE edema. No carotid bruits noted. Pulmonary/Chest: Normal effort and positive vesicular breath sounds. No respiratory distress. No wheezes, rales or ronchi noted.  Abdomen: Soft and nontender. Normal bowel sounds.  Musculoskeletal: Scoliosis noted.  Strength 5/5 BUE/BLE.  No difficulty with gait.  Neurological: Alert and oriented. Cranial nerves II-XII grossly intact. Coordination normal.  Psychiatric: Mood and affect normal. Behavior is normal. Judgment and thought content normal.    BMET    Component Value Date/Time   NA 141 02/15/2020 0851   K 4.0 02/15/2020 0851   CL 107 (H) 02/15/2020 0851   CO2 20 02/15/2020 0851   GLUCOSE 99 02/15/2020 0851   BUN 13 02/15/2020 0851   CREATININE 0.85 02/15/2020 0851   CALCIUM 9.2 02/15/2020 0851   GFRNONAA 74 02/15/2020 0851   GFRAA 85 02/15/2020 0851    Lipid Panel     Component Value Date/Time   CHOL 211 (H) 02/15/2020 0851   TRIG 76 02/15/2020 0851   HDL 67 02/15/2020 0851   CHOLHDL 3.1 02/15/2020 0851   LDLCALC 130 (H) 02/15/2020 0851    CBC    Component Value Date/Time   WBC 7.2 02/15/2020 0851   RBC 4.52 02/15/2020 0851   HGB 13.8 02/15/2020 0851   HCT 40.9 02/15/2020 0851   PLT 223 02/15/2020 0851   MCV 91 02/15/2020 0851   MCH 30.5 02/15/2020 0851   MCHC 33.7 02/15/2020 0851   RDW 12.5 02/15/2020 0851   LYMPHSABS 1.7 02/24/2015 1244   EOSABS 0.3 02/24/2015 1244   BASOSABS 0.0 02/24/2015 1244    Hgb A1C No results found for: HGBA1C         Assessment & Plan:   Preventative Health Maintenance:  She declines  flu shot She declines tetanus today  Encouraged her to get her COVID-vaccine Discussed Shingrix vaccine, she will check coverage with her insurance company Pap smear UTD Mammogram and bone density ordered-she will call to schedule She would like to check with her insurance regarding Cologuard and get back with me on this Encouraged her to consume a balanced diet and exercise regimen Advised her to see an eye doctor and dentist annually We will check CBC, c-Met, lipid profile today  RTC in 1 year, sooner if needed Webb Silversmith, NP This visit occurred during the SARS-CoV-2 public health emergency.  Safety protocols were in place, including screening questions prior to the visit, additional usage of staff PPE, and  extensive cleaning of exam room while observing appropriate contact time as indicated for disinfecting solutions.

## 2021-03-02 NOTE — Assessment & Plan Note (Signed)
Not causing any issues at this time

## 2021-03-02 NOTE — Assessment & Plan Note (Signed)
C-Met and lipid profile today Encouraged her to consume a low-fat diet

## 2021-03-02 NOTE — Patient Instructions (Signed)
Health Maintenance for Postmenopausal Women ?Menopause is a normal process in which your ability to get pregnant comes to an end. This process happens slowly over many months or years, usually between the ages of 48 and 55. Menopause is complete when you have missed your menstrual period for 12 months. ?It is important to talk with your health care provider about some of the most common conditions that affect women after menopause (postmenopausal women). These include heart disease, cancer, and bone loss (osteoporosis). Adopting a healthy lifestyle and getting preventive care can help to promote your health and wellness. The actions you take can also lower your chances of developing some of these common conditions. ?What are the signs and symptoms of menopause? ?During menopause, you may have the following symptoms: ?Hot flashes. These can be moderate or severe. ?Night sweats. ?Decrease in sex drive. ?Mood swings. ?Headaches. ?Tiredness (fatigue). ?Irritability. ?Memory problems. ?Problems falling asleep or staying asleep. ?Talk with your health care provider about treatment options for your symptoms. ?Do I need hormone replacement therapy? ?Hormone replacement therapy is effective in treating symptoms that are caused by menopause, such as hot flashes and night sweats. ?Hormone replacement carries certain risks, especially as you become older. If you are thinking about using estrogen or estrogen with progestin, discuss the benefits and risks with your health care provider. ?How can I reduce my risk for heart disease and stroke? ?The risk of heart disease, heart attack, and stroke increases as you age. One of the causes may be a change in the body's hormones during menopause. This can affect how your body uses dietary fats, triglycerides, and cholesterol. Heart attack and stroke are medical emergencies. There are many things that you can do to help prevent heart disease and stroke. ?Watch your blood pressure ?High  blood pressure causes heart disease and increases the risk of stroke. This is more likely to develop in people who have high blood pressure readings or are overweight. ?Have your blood pressure checked: ?Every 3-5 years if you are 18-39 years of age. ?Every year if you are 40 years old or older. ?Eat a healthy diet ? ?Eat a diet that includes plenty of vegetables, fruits, low-fat dairy products, and lean protein. ?Do not eat a lot of foods that are high in solid fats, added sugars, or sodium. ?Get regular exercise ?Get regular exercise. This is one of the most important things you can do for your health. Most adults should: ?Try to exercise for at least 150 minutes each week. The exercise should increase your heart rate and make you sweat (moderate-intensity exercise). ?Try to do strengthening exercises at least twice each week. Do these in addition to the moderate-intensity exercise. ?Spend less time sitting. Even light physical activity can be beneficial. ?Other tips ?Work with your health care provider to achieve or maintain a healthy weight. ?Do not use any products that contain nicotine or tobacco. These products include cigarettes, chewing tobacco, and vaping devices, such as e-cigarettes. If you need help quitting, ask your health care provider. ?Know your numbers. Ask your health care provider to check your cholesterol and your blood sugar (glucose). Continue to have your blood tested as directed by your health care provider. ?Do I need screening for cancer? ?Depending on your health history and family history, you may need to have cancer screenings at different stages of your life. This may include screening for: ?Breast cancer. ?Cervical cancer. ?Lung cancer. ?Colorectal cancer. ?What is my risk for osteoporosis? ?After menopause, you may be   at increased risk for osteoporosis. Osteoporosis is a condition in which bone destruction happens more quickly than new bone creation. To help prevent osteoporosis or  the bone fractures that can happen because of osteoporosis, you may take the following actions: ?If you are 19-50 years old, get at least 1,000 mg of calcium and at least 600 international units (IU) of vitamin D per day. ?If you are older than age 50 but younger than age 70, get at least 1,200 mg of calcium and at least 600 international units (IU) of vitamin D per day. ?If you are older than age 70, get at least 1,200 mg of calcium and at least 800 international units (IU) of vitamin D per day. ?Smoking and drinking excessive alcohol increase the risk of osteoporosis. Eat foods that are rich in calcium and vitamin D, and do weight-bearing exercises several times each week as directed by your health care provider. ?How does menopause affect my mental health? ?Depression may occur at any age, but it is more common as you become older. Common symptoms of depression include: ?Feeling depressed. ?Changes in sleep patterns. ?Changes in appetite or eating patterns. ?Feeling an overall lack of motivation or enjoyment of activities that you previously enjoyed. ?Frequent crying spells. ?Talk with your health care provider if you think that you are experiencing any of these symptoms. ?General instructions ?See your health care provider for regular wellness exams and vaccines. This may include: ?Scheduling regular health, dental, and eye exams. ?Getting and maintaining your vaccines. These include: ?Influenza vaccine. Get this vaccine each year before the flu season begins. ?Pneumonia vaccine. ?Shingles vaccine. ?Tetanus, diphtheria, and pertussis (Tdap) booster vaccine. ?Your health care provider may also recommend other immunizations. ?Tell your health care provider if you have ever been abused or do not feel safe at home. ?Summary ?Menopause is a normal process in which your ability to get pregnant comes to an end. ?This condition causes hot flashes, night sweats, decreased interest in sex, mood swings, headaches, or lack  of sleep. ?Treatment for this condition may include hormone replacement therapy. ?Take actions to keep yourself healthy, including exercising regularly, eating a healthy diet, watching your weight, and checking your blood pressure and blood sugar levels. ?Get screened for cancer and depression. Make sure that you are up to date with all your vaccines. ?This information is not intended to replace advice given to you by your health care provider. Make sure you discuss any questions you have with your health care provider. ?Document Revised: 05/16/2020 Document Reviewed: 05/16/2020 ?Elsevier Patient Education ? 2022 [employer] ? ?

## 2021-03-02 NOTE — Assessment & Plan Note (Signed)
Controlled on Topamax and Imitrex We will monitor

## 2021-12-12 ENCOUNTER — Other Ambulatory Visit: Payer: Self-pay | Admitting: Internal Medicine

## 2021-12-13 NOTE — Telephone Encounter (Signed)
Requested Prescriptions  Pending Prescriptions Disp Refills   topiramate (TOPAMAX) 50 MG tablet [Pharmacy Med Name: Topiramate 50 MG Oral Tablet] 90 tablet 0    Sig: TAKE 1 TABLET BY MOUTH AT  BEDTIME     Neurology: Anticonvulsants - topiramate & zonisamide Failed - 12/12/2021 11:17 PM      Failed - Cr in normal range and within 360 days    Creatinine, Ser  Date Value Ref Range Status  02/15/2020 0.85 0.57 - 1.00 mg/dL Final         Failed - CO2 in normal range and within 360 days    CO2  Date Value Ref Range Status  02/15/2020 20 20 - 29 mmol/L Final         Failed - ALT in normal range and within 360 days    ALT  Date Value Ref Range Status  02/15/2020 16 0 - 32 IU/L Final         Failed - AST in normal range and within 360 days    AST  Date Value Ref Range Status  02/15/2020 18 0 - 40 IU/L Final         Passed - Completed PHQ-2 or PHQ-9 in the last 360 days      Passed - Valid encounter within last 12 months    Recent Outpatient Visits           9 months ago Encounter for general adult medical examination with abnormal findings   [facility] [place], Salvadore Oxford, NP

## 2022-02-18 ENCOUNTER — Other Ambulatory Visit: Payer: Self-pay | Admitting: Internal Medicine

## 2022-02-19 NOTE — Telephone Encounter (Signed)
Unable to refill per protocol, Rx request is too soon. Last refill 12/13/21 for 90 days.  Requested Prescriptions  Pending Prescriptions Disp Refills   topiramate (TOPAMAX) 50 MG tablet [Pharmacy Med Name: Topiramate 50 MG Oral Tablet] 90 tablet 3    Sig: TAKE 1 TABLET BY MOUTH AT  BEDTIME     Neurology: Anticonvulsants - topiramate & zonisamide Failed - 02/18/2022 10:40 PM      Failed - Cr in normal range and within 360 days    Creatinine, Ser  Date Value Ref Range Status  02/15/2020 0.85 0.57 - 1.00 mg/dL Final         Failed - CO2 in normal range and within 360 days    CO2  Date Value Ref Range Status  02/15/2020 20 20 - 29 mmol/L Final         Failed - ALT in normal range and within 360 days    ALT  Date Value Ref Range Status  02/15/2020 16 0 - 32 IU/L Final         Failed - AST in normal range and within 360 days    AST  Date Value Ref Range Status  02/15/2020 18 0 - 40 IU/L Final         Passed - Completed PHQ-2 or PHQ-9 in the last 360 days      Passed - Valid encounter within last 12 months    Recent Outpatient Visits           11 months ago Encounter for general adult medical examination with abnormal findings   [facility] [place], Coralie Keens, NP

## 2022-04-20 ENCOUNTER — Other Ambulatory Visit: Payer: Self-pay | Admitting: Internal Medicine

## 2022-04-20 ENCOUNTER — Encounter: Payer: Self-pay | Admitting: Internal Medicine
